# Patient Record
Sex: Male | Born: 1980 | Race: Black or African American | Hispanic: No | Marital: Married | Smoking: Current every day smoker
Health system: Southern US, Community
[De-identification: ages and names within clinical notes are randomized; demographics above are authoritative.]

## PROBLEM LIST (undated history)

## (undated) ENCOUNTER — Ambulatory Visit: Admission: EM | Source: Home / Self Care

## (undated) ENCOUNTER — Ambulatory Visit

---
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
## 2008-06-16 ENCOUNTER — Emergency Department (HOSPITAL_COMMUNITY): Admission: EM | Admit: 2008-06-16 | Discharge: 2008-06-16 | Payer: Self-pay | Admitting: Emergency Medicine

## 2010-04-19 LAB — COMPREHENSIVE METABOLIC PANEL
ALT: 10 U/L (ref 0–53)
AST: 20 U/L (ref 0–37)
Albumin: 4.1 g/dL (ref 3.5–5.2)
Alkaline Phosphatase: 94 U/L (ref 39–117)
BUN: 7 mg/dL (ref 6–23)
CO2: 30 mEq/L (ref 19–32)
Calcium: 9.6 mg/dL (ref 8.4–10.5)
Chloride: 104 mEq/L (ref 96–112)
Creatinine, Ser: 1 mg/dL (ref 0.4–1.5)
GFR calc Af Amer: >60 mL/min (ref >60)
GFR calc non Af Amer: >60 mL/min (ref >60)
Glucose, Bld: 94 mg/dL (ref 70–99)
Potassium: 4.3 mEq/L (ref 3.5–5.1)
Sodium: 142 mEq/L (ref 135–145)
Total Bilirubin: 1 mg/dL (ref 0.3–1.2)
Total Protein: 7.3 g/dL (ref 6.0–8.3)

## 2010-04-19 LAB — POCT I-STAT, CHEM 8
BUN: 7 mg/dL (ref 6–23)
Calcium, Ion: 1.16 mmol/L (ref 1.12–1.32)
Chloride: 102 meq/L (ref 96–112)
Creatinine, Ser: 1.1 mg/dL (ref 0.4–1.5)
Glucose, Bld: 91 mg/dL (ref 70–99)
HCT: 50 % (ref 39.0–52.0)
Hemoglobin: 17 g/dL (ref 13.0–17.0)
Potassium: 4.1 meq/L (ref 3.5–5.1)
Sodium: 141 meq/L (ref 135–145)
TCO2: 29 mmol/L (ref 0–100)

## 2010-04-19 LAB — LIPASE, BLOOD: Lipase: 34 U/L (ref 11–59)

## 2010-04-19 LAB — CBC
HCT: 46.6 % (ref 39.0–52.0)
Hemoglobin: 15.8 g/dL (ref 13.0–17.0)
MCHC: 33.9 g/dL (ref 30.0–36.0)
MCV: 96.8 fL (ref 78.0–100.0)
Platelets: 179 10*3/uL (ref 150–400)
RBC: 4.81 MIL/uL (ref 4.22–5.81)
RDW: 12.9 % (ref 11.5–15.5)
WBC: 10.1 10*3/uL (ref 4.0–10.5)

## 2010-04-19 LAB — POCT CARDIAC MARKERS
CKMB, poc: <1 ng/mL — ABNORMAL LOW (ref 1.0–8.0)
Myoglobin, poc: 73.1 ng/mL (ref 12–200)
Troponin i, poc: <0.05 ng/mL (ref 0.00–0.09)

## 2010-04-19 LAB — DIFFERENTIAL
Basophils Absolute: 0 10*3/uL (ref 0.0–0.1)
Basophils Relative: 0 % (ref 0–1)
Eosinophils Absolute: 0 10*3/uL (ref 0.0–0.7)
Eosinophils Relative: 0 % (ref 0–5)
Lymphocytes Relative: 19 % (ref 12–46)
Lymphs Abs: 2 10*3/uL (ref 0.7–4.0)
Monocytes Absolute: 0.7 10*3/uL (ref 0.1–1.0)
Monocytes Relative: 7 % (ref 3–12)
Neutro Abs: 7.3 10*3/uL (ref 1.7–7.7)
Neutrophils Relative %: 73 % (ref 43–77)

## 2010-04-19 LAB — D-DIMER, QUANTITATIVE: D-Dimer, Quant: <0.22 ug/mL-FEU (ref 0.00–0.48)

## 2010-12-02 ENCOUNTER — Emergency Department (HOSPITAL_COMMUNITY): Payer: Managed Care, Other (non HMO)

## 2010-12-02 ENCOUNTER — Emergency Department (HOSPITAL_COMMUNITY)
Admission: EM | Admit: 2010-12-02 | Discharge: 2010-12-02 | Disposition: A | Discharge status: Home / Self Care | Payer: Managed Care, Other (non HMO) | Attending: Emergency Medicine | Admitting: Emergency Medicine

## 2010-12-02 DIAGNOSIS — R109 Unspecified abdominal pain: Secondary | ICD-10-CM | POA: Insufficient documentation

## 2010-12-02 DIAGNOSIS — S39012A Strain of muscle, fascia and tendon of lower back, initial encounter: Secondary | ICD-10-CM

## 2010-12-02 DIAGNOSIS — X58XXXA Exposure to other specified factors, initial encounter: Secondary | ICD-10-CM | POA: Insufficient documentation

## 2010-12-02 DIAGNOSIS — S335XXA Sprain of ligaments of lumbar spine, initial encounter: Secondary | ICD-10-CM | POA: Insufficient documentation

## 2010-12-02 LAB — CBC
HCT: 47.9 % (ref 39.0–52.0)
Hemoglobin: 16.4 g/dL (ref 13.0–17.0)
MCHC: 34.2 g/dL (ref 30.0–36.0)
MCV: 93 fL (ref 78.0–100.0)
RDW: 13.1 % (ref 11.5–15.5)

## 2010-12-02 LAB — URINALYSIS, ROUTINE W REFLEX MICROSCOPIC
Bilirubin Urine: NEGATIVE
Glucose, UA: NEGATIVE mg/dL
Ketones, ur: NEGATIVE mg/dL
Leukocytes, UA: NEGATIVE
Protein, ur: NEGATIVE mg/dL

## 2010-12-02 LAB — DIFFERENTIAL
Basophils Absolute: 0 10*3/uL (ref 0.0–0.1)
Basophils Relative: 0 % (ref 0–1)
Eosinophils Relative: 1 % (ref 0–5)
Lymphocytes Relative: 19 % (ref 12–46)
Monocytes Absolute: 1 10*3/uL (ref 0.1–1.0)
Monocytes Relative: 8 % (ref 3–12)

## 2010-12-02 LAB — BASIC METABOLIC PANEL
BUN: 6 mg/dL (ref 6–23)
CO2: 29 mEq/L (ref 19–32)
Calcium: 9.4 mg/dL (ref 8.4–10.5)
Creatinine, Ser: 0.93 mg/dL (ref 0.50–1.35)

## 2010-12-02 MED ORDER — SODIUM CHLORIDE 0.9 % IV BOLUS (SEPSIS): 1000.0000 mL | Freq: Once | INTRAVENOUS | Status: DC

## 2010-12-02 MED ORDER — HYDROCODONE-ACETAMINOPHEN 5-325 MG PO TABS: 1.0000 | ORAL_TABLET | ORAL | Status: AC | PRN

## 2010-12-02 MED ORDER — CYCLOBENZAPRINE HCL 10 MG PO TABS
10.0000 mg | ORAL_TABLET | Freq: Once | ORAL | Status: AC
  Administered 2010-12-02: 10 mg via ORAL
  Filled 2010-12-02: qty 1

## 2010-12-02 MED ORDER — HYDROCODONE-ACETAMINOPHEN 5-325 MG PO TABS
1.0000 | ORAL_TABLET | Freq: Once | ORAL | Status: AC
  Administered 2010-12-02: 1 via ORAL
  Filled 2010-12-02: qty 1

## 2010-12-02 MED ORDER — CYCLOBENZAPRINE HCL 10 MG PO TABS: 10.0000 mg | ORAL_TABLET | Freq: Two times a day (BID) | ORAL | Status: AC | PRN

## 2010-12-02 MED ORDER — HYDROMORPHONE HCL PF 1 MG/ML IJ SOLN
0.5000 mg | Freq: Once | INTRAMUSCULAR | Status: DC
  Filled 2010-12-02: qty 1

## 2010-12-02 MED ORDER — KETOROLAC TROMETHAMINE 30 MG/ML IJ SOLN
30.0000 mg | Freq: Once | INTRAMUSCULAR | Status: DC
  Filled 2010-12-02: qty 1

## 2010-12-02 MED ORDER — ONDANSETRON HCL 4 MG/2ML IJ SOLN
4.0000 mg | Freq: Once | INTRAMUSCULAR | Status: DC
  Filled 2010-12-02: qty 2

## 2010-12-02 NOTE — ED Provider Notes (Signed)
History     CSN: [id number] Arrival date & time: 12/02/2010  9:46 AM   First MD Initiated Contact with Patient 12/02/10 1010      No chief complaint on file.   (Consider location/radiation/quality/duration/timing/severity/associated sxs/prior treatment) HPI Comments: Patient states that he was seen at an [facility] 2 weeks ago told that he had blood in his urine so he must have a kidney stone - states no imaging was done, states pain was worse on the left but now with bilateral flank pain - denies frank hematuria, dysuria, fever, chills, testicular pain, urethral discharge.  Patient is a 30 y.o. male presenting with flank pain. The history is provided by the patient. No language interpreter was used.  Flank Pain This is a recurrent problem. The current episode started 1 to 4 weeks ago. The problem occurs constantly. The problem has been unchanged. Pertinent negatives include no abdominal pain, chills, coughing, fever, myalgias, nausea, sore throat, urinary symptoms or vomiting. The symptoms are aggravated by nothing. He has tried nothing for the symptoms.    History reviewed. No pertinent past medical history.  No past surgical history on file.  No family history on file.  History  Substance Use Topics  . Smoking status: Not on file  . Smokeless tobacco: Not on file  . Alcohol Use: Not on file      Review of Systems  Constitutional: Negative for fever and chills.  HENT: Negative for sore throat.   Respiratory: Negative for cough.   Gastrointestinal: Negative for nausea, vomiting and abdominal pain.  Genitourinary: Positive for flank pain.  Musculoskeletal: Negative for myalgias.  All other systems reviewed and are negative.    Allergies  Review of patient's allergies indicates no known allergies.  Home Medications  No current outpatient prescriptions on file.  BP 137/92  Pulse 92  Temp 98.6 F (37 C)  Ht 5\' 10"  (1.778 m)  Wt 195 lb (88.451 kg)  BMI 27.98 kg/m2   SpO2 100%  Physical Exam  Nursing note and vitals reviewed. Constitutional: He is oriented to person, place, and time. He appears well-developed and well-nourished.  HENT:  Head: Normocephalic and atraumatic.  Mouth/Throat: Oropharynx is clear and moist.  Eyes: Conjunctivae are normal. Pupils are equal, round, and reactive to light.  Neck: Normal range of motion. Neck supple.  Cardiovascular: Normal rate, regular rhythm, normal heart sounds and intact distal pulses.   Pulmonary/Chest: Effort normal and breath sounds normal.  Abdominal: Soft. Bowel sounds are normal. He exhibits no distension. There is no tenderness. There is no CVA tenderness.  Musculoskeletal: Normal range of motion.  Neurological: He is alert and oriented to person, place, and time. No cranial nerve deficit.  Skin: Skin is warm and dry.  Psychiatric: He has a normal mood and affect. His behavior is normal. Judgment and thought content normal.    ED Course  Procedures (including critical care time)   Labs Reviewed  CBC  DIFFERENTIAL  BASIC METABOLIC PANEL  URINALYSIS, ROUTINE W REFLEX MICROSCOPIC   No results found.   Lumbar Strain   MDM  CT scan without kidney stones - shows intusseption, no blood in urine.  Believe this to be MSK lower back pain - no alarming signs of cauda equina, epidural abscess or hematoma.        Bryan Matthews [place], [place] 12/02/10 1302

## 2010-12-02 NOTE — ED Notes (Signed)
Pt. Is unable to go to the restroom at this time.  

## 2010-12-02 NOTE — ED Notes (Signed)
Patient here with ongoing bilateral flank pain x 2 months, was diagnosed with kidney stones 2 months ago. Denies dysuria.

## 2010-12-02 NOTE — ED Notes (Signed)
Patient in CT will collect labs when patient return

## 2010-12-02 NOTE — ED Notes (Signed)
Refuses IV and meds until he speaks with Dr. Freida Busman

## 2010-12-04 NOTE — ED Provider Notes (Signed)
Medical screening examination/treatment/procedure(s) were performed by non-physician practitioner and as supervising physician I was immediately available for consultation/collaboration.  Toy Baker, MD 12/04/10 [phone]

## 2011-04-26 ENCOUNTER — Emergency Department (HOSPITAL_COMMUNITY): Payer: Managed Care, Other (non HMO)

## 2011-04-26 ENCOUNTER — Emergency Department (HOSPITAL_COMMUNITY)
Admission: EM | Admit: 2011-04-26 | Discharge: 2011-04-26 | Disposition: A | Discharge status: Home / Self Care | Payer: Managed Care, Other (non HMO) | Attending: Emergency Medicine | Admitting: Emergency Medicine

## 2011-04-26 ENCOUNTER — Encounter (HOSPITAL_COMMUNITY): Payer: Self-pay | Admitting: *Deleted

## 2011-04-26 DIAGNOSIS — J45909 Unspecified asthma, uncomplicated: Secondary | ICD-10-CM | POA: Insufficient documentation

## 2011-04-26 DIAGNOSIS — R079 Chest pain, unspecified: Secondary | ICD-10-CM | POA: Insufficient documentation

## 2011-04-26 MED ORDER — IBUPROFEN 200 MG PO TABS
400.0000 mg | ORAL_TABLET | Freq: Once | ORAL | Status: AC
Start: 2011-04-26 — End: 2011-04-26
  Administered 2011-04-26: 400 mg via ORAL
  Filled 2011-04-26: qty 2

## 2011-04-26 NOTE — ED Notes (Signed)
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

## 2011-04-26 NOTE — ED Notes (Signed)
c/o R chest pain, worse with movement, deep breath. Hurts to lift R arm. Does a lot of pushing/pulling heavy objects. Denies n/v, diaphoresis, sob. No cardiac Hx. Sts had similar pain in back a few months ago that was Dx as muscle spasms. Pt in NAD, A&Ox4, sitting up in bed, on phone.

## 2011-04-26 NOTE — ED Provider Notes (Signed)
History    31 year old male with chest pain. Acute onset this morning around 645. Onset of pain was has patient got up out of bed and was stretching. A right sided. Her reticulocyte. Worsening takes a deep breath. Does not feel short of breath. Pain is also exacerbated by movement of his right shoulder. Went to bed in his usual state of health. No history of similar pain. No unusual leg pain or swelling. Denies history of blood clot. Patient is a smoker.  CSN: [id number]  Arrival date & time 04/26/11  4696   First MD Initiated Contact with Patient 04/26/11 [phone]      Chief Complaint  Patient presents with  . Chest Pain    (Consider location/radiation/quality/duration/timing/severity/associated sxs/prior treatment) HPI  Past Medical History  Diagnosis Date  . Asthma     History reviewed. No pertinent past surgical history.  No family history on file.  History  Substance Use Topics  . Smoking status: Current Everyday Smoker -- 1.0 packs/day  . Smokeless tobacco: Not on file  . Alcohol Use: 7.2 oz/week    12 Cans of beer per week      Review of Systems   Review of symptoms negative unless otherwise noted in HPI.   Allergies  Review of patient's allergies indicates no known allergies.  Home Medications  No current outpatient prescriptions on file.  BP 143/84  Pulse 79  Temp(Src) 97.8 F (36.6 C) (Oral)  Resp 18  Wt 170 lb (77.111 kg)  SpO2 99%  Physical Exam  Nursing note and vitals reviewed. Constitutional: He appears well-developed and well-nourished. No distress.  HENT:  Head: Normocephalic and atraumatic.  Eyes: Conjunctivae are normal. Pupils are equal, round, and reactive to light. Right eye exhibits no discharge. Left eye exhibits no discharge.  Neck: Neck supple.  Cardiovascular: Normal rate, regular rhythm and normal heart sounds.  Exam reveals no gallop and no friction rub.   No murmur heard. Pulmonary/Chest: Effort normal and breath sounds  normal. No respiratory distress. He exhibits tenderness.       Chest wall normal limits to inspection. No lesions noted. Moderate tenderness in the right pectoral. No crepitus. Breath sounds were symmetric with good air movement. Lungs are clear bilaterally.  Abdominal: Soft. He exhibits no distension. There is no tenderness.  Musculoskeletal: He exhibits no edema and no tenderness.       Lower extremities symmetric as compared to each other. There is no calf tenderness. No edema. Negative Homans.  Neurological: He is alert.  Skin: Skin is warm and dry. He is not diaphoretic.  Psychiatric: He has a normal mood and affect. His behavior is normal. Thought content normal.    ED Course  Procedures (including critical care time)  Labs Reviewed - No data to display Dg Chest 2 View  04/26/2011  *RADIOLOGY REPORT*  Clinical Data: Chest pain.  CHEST - 2 VIEW  Comparison: 06/16/2008  Findings: Slight peribronchial thickening. Heart and mediastinal contours are within normal limits.  No focal opacities or effusions.  No acute bony abnormality.  IMPRESSION: Slight bronchitic changes.  Original Report Authenticated By: Cyndie Chime, M.D.     1. Chest pain     EKG:  Rhythm:NSR Rate: 74 Axis: R Intervals: normal ST segments: NS ST changes. t wave flattening in lead III   MDM  31 year old male with chest pain. Patient's EKG does show a large S wave in lead 1 and right axis deviation, but these are nonspecific changes. There is no  prior for comparison. Pulmonary embolism was considered but clinically doubt. Possible musculoskeletal w/ reproducibility. Chest x-ray was obtained and reviewed personally. There is no evidence of pneumothorax or other acute pathology.. Patient has no respiratory distress on exam and symmetric breath sounds b/l. Plan symptomatic treatment. Return precautions were discussed. Outpatient followup as needed.Raeford Razor, MD 04/26/11 [phone]

## 2011-04-26 NOTE — Discharge Instructions (Signed)
Chest Pain (Nonspecific) It is often hard to give a specific diagnosis for the cause of chest pain. There is always a chance that your pain could be related to something serious, such as a heart attack or a blood clot in the lungs. You need to follow up with your caregiver for further evaluation. CAUSES   Heartburn.   Pneumonia or bronchitis.   Anxiety or stress.   Inflammation around your heart (pericarditis) or lung (pleuritis or pleurisy).   A blood clot in the lung.   A collapsed lung (pneumothorax). It can develop suddenly on its own (spontaneous pneumothorax) or from injury (trauma) to the chest.   Shingles infection (herpes zoster virus).  The chest wall is composed of bones, muscles, and cartilage. Any of these can be the source of the pain.  The bones can be bruised by injury.   The muscles or cartilage can be strained by coughing or overwork.   The cartilage can be affected by inflammation and become sore (costochondritis).  DIAGNOSIS  Lab tests or other studies, such as X-rays, electrocardiography, stress testing, or cardiac imaging, may be needed to find the cause of your pain.  TREATMENT   Treatment depends on what may be causing your chest pain. Treatment may include:   Acid blockers for heartburn.   Anti-inflammatory medicine.   Pain medicine for inflammatory conditions.   Antibiotics if an infection is present.   You may be advised to change lifestyle habits. This includes stopping smoking and avoiding alcohol, caffeine, and chocolate.   You may be advised to keep your head raised (elevated) when sleeping. This reduces the chance of acid going backward from your stomach into your esophagus.   Most of the time, nonspecific chest pain will improve within 2 to 3 days with rest and mild pain medicine.  HOME CARE INSTRUCTIONS   If antibiotics were prescribed, take your antibiotics as directed. Finish them even if you start to feel better.   For the next few  days, avoid physical activities that bring on chest pain. Continue physical activities as directed.   Do not smoke.   Avoid drinking alcohol.   Only take over-the-counter or prescription medicine for pain, discomfort, or fever as directed by your caregiver.   Follow your caregiver's suggestions for further testing if your chest pain does not go away.   Keep any follow-up appointments you made. If you do not go to an appointment, you could develop lasting (chronic) problems with pain. If there is any problem keeping an appointment, you must call to reschedule.  SEEK MEDICAL CARE IF:   You think you are having problems from the medicine you are taking. Read your medicine instructions carefully.   Your chest pain does not go away, even after treatment.   You develop a rash with blisters on your chest.  SEEK IMMEDIATE MEDICAL CARE IF:   You have increased chest pain or pain that spreads to your arm, neck, jaw, back, or abdomen.   You develop shortness of breath, an increasing cough, or you are coughing up blood.   You have severe back or abdominal pain, feel nauseous, or vomit.   You develop severe weakness, fainting, or chills.   You have a fever.  THIS IS AN EMERGENCY. Do not wait to see if the pain will go away. Get medical help at once. Call your local emergency services (911 in [place] Do not drive yourself to the hospital. MAKE SURE YOU:   Understand these instructions.     Will watch your condition.   Will get help right away if you are not doing well or get worse.  Document Released: 10/06/2004 Document Revised: 12/16/2010 Document Reviewed: 08/02/2007 [facility] Patient Information 2012 [place], [place].  RESOURCE GUIDE  Dental Problems  Patients with Medicaid: [facility] [phone] [address]                                           [phone] [address] Phone:  [phone]                                                   Phone:  [phone]  If unable to pay or uninsured, contact:  [facility] or [facility]. to become qualified for the adult dental clinic.  Chronic Pain Problems Contact [facility]  [phone] Patients need to be referred by their primary care doctor.  Insufficient Money for Medicine Contact United Way:  call "211" or [facility] 581-507-4060.  No Primary Care Doctor Call [facility]  [phone] Other agencies that provide inexpensive medical care    [facility]  [phone]    [facility] Internal Medicine  [phone]    [facility]  [phone]    [facility]  [phone]    [facility]  [phone]    [facility] Child Clinic  [phone]  Psychological Services [facility] Behavioral Health  [phone] [facility] Services  [phone] [facility] Mental Health   [phone] (emergency services (724)235-0299)  Substance Abuse Resources Alcohol and Drug Services  219-085-4925 [facility] [phone] [facility] [phone] Floydene Flock [phone] Residential & Outpatient Substance Abuse Program  623-813-4119  Abuse/Neglect [facility] Child Abuse Hotline 7324430826 [facility] Child Abuse Hotline 613-355-1006 (After Hours)  Emergency [facility] Ministries (928) [phone]  Maternity Homes [facility] at the [place] [facility] [phone] [facility] 9403505928  MRSA Hotline #:   984-797-2868    [facility] Resources  [facility]     [employer]                          [facility] Dept. [address] [place]                       [address]      [address]                                                  Cristobal Goldmann Phone:  [phone]  Phone:  [phone]                 Phone:  [phone]  [facility] Mental Health Phone:   [phone]  [facility] Child Abuse Hotline 475 037 7747 (209)154-1208 (After Hours)  Chest Pain, Nonspecific It is often hard to give a specific diagnosis for the cause of chest pain. There is always a chance that your pain could be related to something serious, like a heart attack or a blood clot in the lungs. You need to follow up with your caregiver for further evaluation. More lab tests or other studies such as X-rays, electrocardiography, stress testing, or cardiac imaging may be needed to find the cause of your pain. Most of the time, nonspecific chest pain improves within 2 to 3 days with rest and mild pain medicine. For the next few days, avoid physical exertion or activities that bring on pain. Do not smoke. Avoid drinking alcohol. Call your caregiver for routine follow-up as advised.  SEEK IMMEDIATE MEDICAL CARE IF:  You develop increased chest pain or pain that radiates to the arm, neck, jaw, back, or abdomen.   You develop shortness of breath, increased coughing, or you start coughing up blood.   You have severe back or abdominal pain, nausea, or vomiting.   You develop severe weakness, fainting, fever, or chills.  Document Released: 12/27/2004 Document Revised: 12/16/2010 Document Reviewed: 06/16/2006 [facility] Patient Information 2012 [place], [place].

## 2012-12-23 ENCOUNTER — Emergency Department (HOSPITAL_COMMUNITY): Payer: Self-pay

## 2012-12-23 ENCOUNTER — Encounter (HOSPITAL_COMMUNITY): Payer: Self-pay | Admitting: Emergency Medicine

## 2012-12-23 ENCOUNTER — Emergency Department (HOSPITAL_COMMUNITY)
Admission: EM | Admit: 2012-12-23 | Discharge: 2012-12-23 | Disposition: A | Discharge status: Home / Self Care | Payer: Self-pay | Attending: Emergency Medicine | Admitting: Emergency Medicine

## 2012-12-23 DIAGNOSIS — J45901 Unspecified asthma with (acute) exacerbation: Secondary | ICD-10-CM | POA: Insufficient documentation

## 2012-12-23 DIAGNOSIS — Z7982 Long term (current) use of aspirin: Secondary | ICD-10-CM | POA: Insufficient documentation

## 2012-12-23 DIAGNOSIS — M940 Chondrocostal junction syndrome [Tietze]: Secondary | ICD-10-CM | POA: Insufficient documentation

## 2012-12-23 DIAGNOSIS — F17200 Nicotine dependence, unspecified, uncomplicated: Secondary | ICD-10-CM | POA: Insufficient documentation

## 2012-12-23 DIAGNOSIS — R0789 Other chest pain: Secondary | ICD-10-CM | POA: Insufficient documentation

## 2012-12-23 LAB — CBC
HCT: 46.3 % (ref 39.0–52.0)
Hemoglobin: 16.5 g/dL (ref 13.0–17.0)
MCH: 32.3 pg (ref 26.0–34.0)
MCHC: 35.6 g/dL (ref 30.0–36.0)
MCV: 90.6 fL (ref 78.0–100.0)
RDW: 13.5 % (ref 11.5–15.5)

## 2012-12-23 LAB — BASIC METABOLIC PANEL
BUN: 12 mg/dL (ref 6–23)
Calcium: 9.2 mg/dL (ref 8.4–10.5)
Creatinine, Ser: 0.91 mg/dL (ref 0.50–1.35)
GFR calc Af Amer: >90 mL/min (ref >90)
GFR calc non Af Amer: >90 mL/min (ref >90)
Glucose, Bld: 110 mg/dL — ABNORMAL HIGH (ref 70–99)
Potassium: 3.9 mEq/L (ref 3.5–5.1)

## 2012-12-23 LAB — POCT I-STAT TROPONIN I: Troponin i, poc: 0.04 ng/mL (ref 0.00–0.08)

## 2012-12-23 MED ORDER — MELOXICAM 15 MG PO TABS: 15.0000 mg | ORAL_TABLET | Freq: Every day | ORAL | Status: DC

## 2012-12-23 MED ORDER — CYCLOBENZAPRINE HCL 10 MG PO TABS
5.0000 mg | ORAL_TABLET | Freq: Once | ORAL | Status: AC
  Administered 2012-12-23: 5 mg via ORAL
  Filled 2012-12-23: qty 1

## 2012-12-23 MED ORDER — ASPIRIN 81 MG PO CHEW
324.0000 mg | CHEWABLE_TABLET | Freq: Once | ORAL | Status: AC
  Administered 2012-12-23: 324 mg via ORAL
  Filled 2012-12-23: qty 4

## 2012-12-23 MED ORDER — ALBUTEROL SULFATE (5 MG/ML) 0.5% IN NEBU
5.0000 mg | INHALATION_SOLUTION | Freq: Once | RESPIRATORY_TRACT | Status: AC
  Administered 2012-12-23: 5 mg via RESPIRATORY_TRACT
  Filled 2012-12-23: qty 1

## 2012-12-23 MED ORDER — CYCLOBENZAPRINE HCL 10 MG PO TABS: 10.0000 mg | ORAL_TABLET | Freq: Three times a day (TID) | ORAL | Status: DC | PRN

## 2012-12-23 MED ORDER — KETOROLAC TROMETHAMINE 30 MG/ML IJ SOLN
30.0000 mg | Freq: Once | INTRAMUSCULAR | Status: AC
  Administered 2012-12-23: 30 mg via INTRAVENOUS
  Filled 2012-12-23: qty 1

## 2012-12-23 NOTE — ED Provider Notes (Signed)
CSN: [id number]     Arrival date & time 12/23/12  0203 History   First MD Initiated Contact with Patient 12/23/12 0302     Chief Complaint  Patient presents with  . Chest Pain   HPI  History provided by the patient. The patient is a 32 year old male with history of asthma who is a current every day smoker and presents with complaints of left chest pain. Patient states that he awoke from his sleep around 11:00 PM with significant sharp severe left chest pain. Patient states that if he attempts to move his left arm pain is worsened. Pain does not radiate significantly. It is a sharp stabbing and squeezing pain. It is associated with slight shortness of breath. Denies any heart palpitations. Patient does report that he was playing with his daughter earlier in the evening prior to going to sleep. Denies any specific injury while lifting or playing with her. He has not used any medications or treatment for symptoms. Denies any other aggravating or alleviating factors. Denies any associated diaphoresis or nausea. No recent URI symptoms or fever.    Past Medical History  Diagnosis Date  . Asthma    History reviewed. No pertinent past surgical history. History reviewed. No pertinent family history. History  Substance Use Topics  . Smoking status: Current Every Day Smoker -- 1.00 packs/day  . Smokeless tobacco: Not on file  . Alcohol Use: 7.2 oz/week    12 Cans of beer per week    Review of Systems  Constitutional: Negative for fever and diaphoresis.  Respiratory: Negative for cough.   Cardiovascular: Positive for chest pain. Negative for palpitations and leg swelling.  All other systems reviewed and are negative.    Allergies  Review of patient's allergies indicates no known allergies.  Home Medications   Current Outpatient Rx  Name  Route  Sig  Dispense  Refill  . aspirin EC 325 MG tablet   Oral   Take 325 mg by mouth daily.          BP 158/97  Pulse 92  Temp(Src) 97.9 F  (36.6 C) (Oral)  Resp 20  Ht 5\' 7"  (1.702 m)  Wt 165 lb (74.844 kg)  BMI 25.84 kg/m2  SpO2 98% Physical Exam  Nursing note and vitals reviewed. Constitutional: He is oriented to person, place, and time. He appears well-developed and well-nourished.  HENT:  Head: Normocephalic.  Cardiovascular: Normal rate and regular rhythm.   Pulmonary/Chest: Effort normal and breath sounds normal. No respiratory distress. He has no wheezes. He has no rales. He exhibits tenderness.    Point tenderness over the sternal clavicle joint without deformity. No erythema of the skin. There is some tenderness over the lateral left pectoralis major muscle as well. Pain is worsened with abduction and external rotation of the left arm and shoulder.  Abdominal: Soft. There is no tenderness. There is no rebound and no guarding.  Musculoskeletal: He exhibits no edema.  Neurological: He is alert and oriented to person, place, and time.  Skin: Skin is warm. No rash noted. No erythema.  Psychiatric: He has a normal mood and affect. His behavior is normal.    ED Course  Procedures   DIAGNOSTIC STUDIES: Oxygen Saturation is 98% on room air.    COORDINATION OF CARE:  Nursing notes reviewed. Vital signs reviewed. Initial pt interview and examination performed.   3:27 AM-patient seen and evaluated. Patient does appear in mild discomfort. His pain is significant reproducible over the sternal clavicle  joint and pectoralis major muscle areas. No gross deformities. Patient is PERC negative. Discussed work up plan with pt at bedside, which includes lab testing, EKG and chest x-ray. Pt agrees with plan.  Patient reports having significant improvements of pain and discomfort after medications. They're still tenderness to palpation over the left chest wall. Unremarkable lab testing, EKG, troponin and chest x-ray. At this time do not suspect any concerning cardiopulmonary cause of symptoms. Patient is in good condition for  discharge was symptomatically of muscle skeletal chest pain. He agrees with plan.   Treatment plan initiated: Medications  aspirin chewable tablet 324 mg (324 mg Oral Given 12/23/12 0401)  ketorolac (TORADOL) 30 MG/ML injection 30 mg (30 mg Intravenous Given 12/23/12 0402)  albuterol (PROVENTIL) (5 MG/ML) 0.5% nebulizer solution 5 mg (5 mg Nebulization Given 12/23/12 0401)  cyclobenzaprine (FLEXERIL) tablet 5 mg (5 mg Oral Given 12/23/12 0401)   Results for orders placed during the hospital encounter of 12/23/12  CBC      Result Value Range   WBC 13.5 (*) 4.0 - 10.5 K/uL   RBC 5.11  4.22 - 5.81 MIL/uL   Hemoglobin 16.5  13.0 - 17.0 g/dL   HCT 16.1  09.6 - 04.5 %   MCV 90.6  78.0 - 100.0 fL   MCH 32.3  26.0 - 34.0 pg   MCHC 35.6  30.0 - 36.0 g/dL   RDW 40.9  81.1 - 91.4 %   Platelets 252  150 - 400 K/uL  BASIC METABOLIC PANEL      Result Value Range   Sodium 137  135 - 145 mEq/L   Potassium 3.9  3.5 - 5.1 mEq/L   Chloride 100  96 - 112 mEq/L   CO2 23  19 - 32 mEq/L   Glucose, Bld 110 (*) 70 - 99 mg/dL   BUN 12  6 - 23 mg/dL   Creatinine, Ser 7.82  0.50 - 1.35 mg/dL   Calcium 9.2  8.4 - 95.6 mg/dL   GFR calc non Af Amer >90  >90 mL/min   GFR calc Af Amer >90  >90 mL/min  POCT I-STAT TROPONIN I      Result Value Range   Troponin i, poc 0.04  0.00 - 0.08 ng/mL   Comment 3               Imaging Review Dg Chest 2 View  12/23/2012   CLINICAL DATA:  Chest pain, shortness of breath, history of asthma  EXAM: CHEST  2 VIEW  COMPARISON:  Prior radiograph from 04/26/2011  FINDINGS: The cardiac and mediastinal silhouettes are within normal limits.  The lungs are normally inflated. Mild diffuse bronchitic changes are noted. No airspace consolidation, pleural effusion, or pulmonary edema is identified. There is no pneumothorax.  No acute osseous abnormality identified.  IMPRESSION: Mild diffuse bronchitic changes, likely related to smoking. No acute cardiopulmonary process.    Electronically Signed   By: Rise Mu M.D.   On: 12/23/2012 03:27    EKG Interpretation    Date/Time:  Sunday December 23 2012 02:07:56 EST Ventricular Rate:  96 PR Interval:  150 QRS Duration: 84 QT Interval:  332 QTC Calculation: 419 R Axis:   87 Text Interpretation:  Normal sinus rhythm Nonspecific ST and T wave abnormality No significant change since last tracing Confirmed by PLUNKETT  MD, WHITNEY ([id number]) on 12/23/2012 2:09:28 AM            MDM   1. Musculoskeletal chest pain  2. Costochondritis        Angus Seller, PA-C 12/24/12 0400

## 2012-12-23 NOTE — ED Notes (Signed)
Patient medicated, see MAR.

## 2012-12-23 NOTE — ED Notes (Signed)
Pt arrived to ED with a complaint of chest pain that started last night around 2200 hrs.  Pain feels like a squeezing and twisting sensation.  Pt states that he has had chest wall pain previously but that this pain is not like that. Pt state he has had some shorrtness of breath.

## 2012-12-23 NOTE — ED Notes (Signed)
Patient states that he was playing with his son last night and then his left arm "gave way just a little bit." Patient with c/o midsternal CP that radiates towards left arm Patient states that he has had previous episodes of CP, but does not remember the diagnosis when he went to ED--"I think they said inflammation in my chest."

## 2012-12-24 NOTE — ED Provider Notes (Signed)
Medical screening examination/treatment/procedure(s) were performed by non-physician practitioner and as supervising physician I was immediately available for consultation/collaboration.  EKG Interpretation    Date/Time:  Sunday December 23 2012 02:07:56 EST Ventricular Rate:  96 PR Interval:  150 QRS Duration: 84 QT Interval:  332 QTC Calculation: 419 R Axis:   87 Text Interpretation:  Normal sinus rhythm Nonspecific ST and T wave abnormality No significant change since last tracing Confirmed by Anitra Lauth  MD, Clevie Prout ([id number]) on 12/23/2012 2:09:28 AM              Gwyneth Sprout, MD 12/24/12 1610

## 2014-03-24 ENCOUNTER — Encounter (HOSPITAL_COMMUNITY): Payer: Self-pay | Admitting: Emergency Medicine

## 2014-03-24 ENCOUNTER — Emergency Department (HOSPITAL_COMMUNITY)
Admission: EM | Admit: 2014-03-24 | Discharge: 2014-03-24 | Disposition: A | Discharge status: Home / Self Care | Payer: Managed Care, Other (non HMO) | Attending: Emergency Medicine | Admitting: Emergency Medicine

## 2014-03-24 ENCOUNTER — Emergency Department (HOSPITAL_COMMUNITY): Payer: Managed Care, Other (non HMO)

## 2014-03-24 DIAGNOSIS — Z791 Long term (current) use of non-steroidal anti-inflammatories (NSAID): Secondary | ICD-10-CM | POA: Insufficient documentation

## 2014-03-24 DIAGNOSIS — R0789 Other chest pain: Secondary | ICD-10-CM | POA: Insufficient documentation

## 2014-03-24 DIAGNOSIS — J45901 Unspecified asthma with (acute) exacerbation: Secondary | ICD-10-CM | POA: Insufficient documentation

## 2014-03-24 DIAGNOSIS — Z720 Tobacco use: Secondary | ICD-10-CM | POA: Insufficient documentation

## 2014-03-24 DIAGNOSIS — M791 Myalgia: Secondary | ICD-10-CM | POA: Insufficient documentation

## 2014-03-24 DIAGNOSIS — Z7982 Long term (current) use of aspirin: Secondary | ICD-10-CM | POA: Insufficient documentation

## 2014-03-24 DIAGNOSIS — R0602 Shortness of breath: Secondary | ICD-10-CM | POA: Insufficient documentation

## 2014-03-24 DIAGNOSIS — R079 Chest pain, unspecified: Secondary | ICD-10-CM

## 2014-03-24 LAB — CBC
HCT: 47.7 % (ref 39.0–52.0)
Hemoglobin: 16.6 g/dL (ref 13.0–17.0)
MCH: 32.2 pg (ref 26.0–34.0)
MCHC: 34.8 g/dL (ref 30.0–36.0)
MCV: 92.6 fL (ref 78.0–100.0)
Platelets: 223 10*3/uL (ref 150–400)
RBC: 5.15 MIL/uL (ref 4.22–5.81)
RDW: 13 % (ref 11.5–15.5)
WBC: 9.3 10*3/uL (ref 4.0–10.5)

## 2014-03-24 LAB — BASIC METABOLIC PANEL
Anion gap: 9 (ref 5–15)
BUN: 11 mg/dL (ref 6–23)
CO2: 25 mmol/L (ref 19–32)
Calcium: 9.1 mg/dL (ref 8.4–10.5)
Chloride: 105 mmol/L (ref 96–112)
Creatinine, Ser: 1.03 mg/dL (ref 0.50–1.35)
GFR calc Af Amer: >90 mL/min (ref >90)
GLUCOSE: 116 mg/dL — AB (ref 70–99)
POTASSIUM: 3.7 mmol/L (ref 3.5–5.1)
Sodium: 139 mmol/L (ref 135–145)

## 2014-03-24 LAB — I-STAT TROPONIN, ED: Troponin i, poc: 0 ng/mL (ref 0.00–0.08)

## 2014-03-24 MED ORDER — NAPROXEN 500 MG PO TABS: 500.0000 mg | ORAL_TABLET | Freq: Two times a day (BID) | ORAL | Status: DC

## 2014-03-24 NOTE — Discharge Instructions (Signed)
Chest Wall Pain °Chest wall pain is pain in or around the bones and muscles of your chest. It may take up to 6 weeks to get better. It may take longer if you must stay physically active in your work and activities.  °CAUSES  °Chest wall pain may happen on its own. However, it may be caused by: °· A viral illness like the flu. °· Injury. °· Coughing. °· Exercise. °· Arthritis. °· Fibromyalgia. °· Shingles. °HOME CARE INSTRUCTIONS  °· Avoid overtiring physical activity. Try not to strain or perform activities that cause pain. This includes any activities using your chest or your abdominal and side muscles, especially if heavy weights are used. °· Put ice on the sore area. °¨ Put ice in a plastic bag. °¨ Place a towel between your skin and the bag. °¨ Leave the ice on for 15-20 minutes per hour while awake for the first 2 days. °· Only take over-the-counter or prescription medicines for pain, discomfort, or fever as directed by your caregiver. °SEEK IMMEDIATE MEDICAL CARE IF:  °· Your pain increases, or you are very uncomfortable. °· You have a fever. °· Your chest pain becomes worse. °· You have new, unexplained symptoms. °· You have nausea or vomiting. °· You feel sweaty or lightheaded. °· You have a cough with phlegm (sputum), or you cough up blood. °MAKE SURE YOU:  °· Understand these instructions. °· Will watch your condition. °· Will get help right away if you are not doing well or get worse. °Document Released: 12/27/2004 Document Revised: 03/21/2011 Document Reviewed: 08/23/2010 °ExitCare® Patient Information ©2015 [employer]. This information is not intended to replace advice given to you by your health care provider. Make sure you discuss any questions you have with your health care provider. ° ° °Emergency Department Resource Guide °1) Find a Doctor and Pay Out of Pocket °Although you won't have to find out who is covered by your insurance plan, it is a good idea to ask around and get recommendations. You  will then need to call the office and see if the doctor you have chosen will accept you as a new patient and what types of options they offer for patients who are self-pay. Some doctors offer discounts or will set up payment plans for their patients who do not have insurance, but you will need to ask so you aren't surprised when you get to your appointment. ° °2) Contact Your Local Health Department °Not all health departments have doctors that can see patients for sick visits, but many do, so it is worth a call to see if yours does. If you don't know where your local health department is, you can check in your phone book. The CDC also has a tool to help you locate your state's health department, and many state websites also have listings of all of their local health departments. ° °3) Find a Walk-in Clinic °If your illness is not likely to be very severe or complicated, you may want to try a walk in clinic. These are popping up all over the country in pharmacies, drugstores, and shopping centers. They're usually staffed by nurse practitioners or physician assistants that have been trained to treat common illnesses and complaints. They're usually fairly quick and inexpensive. However, if you have serious medical issues or chronic medical problems, these are probably not your best option. ° °No Primary Care Doctor: °- Call [facility] Connect at  832-8000 - they can help you locate a primary care doctor that    accepts your insurance, provides certain services, etc. °- Physician Referral Service- 1-800-533-3463 ° °Chronic Pain Problems: °Organization         Address  Phone   Notes  °[facility]  (336) 297-2271 Patients need to be referred by their primary care doctor.  ° °Medication Assistance: °Organization         Address  Phone   Notes  °[place] [facility] [address] °[place], [place] [address] (336) 641-8030 --Must be a resident of [place] °-- Must  have NO insurance coverage whatsoever (no Medicaid/ Medicare, etc.) °-- The pt. MUST have a primary care doctor that directs their care regularly and follows them in the community °  °[facility]  (866) 331-1348   °[employer]  (888) 892-1162   ° °Agencies that provide inexpensive medical care: °Organization         Address  Phone   Notes  °[facility]  (336) 832-8035   °[facility]    (336) 832-7272   °[facility] [address] °[place], [place] [address] (336) 832-4777   °[facility] [address], °[place] (336) 271-4999   °[facility]    (336) 373-0678   °[facility]    (336) 272-1050   °[facility] ° [address], [place] Phone:  (336) 832-4444, Fax:  (336) 832-4440 Hours of Operation:  9 am - 6 pm, M-F.  Also accepts Medicaid/Medicare and self-pay.  °[facility] ° [address], [place] Phone: (336) 832-3150, Fax: (336) 832-3151. Hours of Operation:  8:30 am - 5:30 pm, M-F.  Also accepts Medicaid and self-pay.  °[facility] [place] [address], [place] Phone: (336) 878-6027   °[facility] [address], [place], [place] (336)723-1848, Ext. 123 Mondays & Thursdays: 7-9 AM.  First 15 patients are seen on a first come, first serve basis. °  ° °Medicaid-accepting [place] Providers: ° °Organization         Address  Phone   Notes  °[facility] [address], [place] (336) 641-2100 Also accepts self-pay patients.  °[facility] [address], [place] ° (336) 856-9996   °[facility] [address], [place] (336) 288-8857   °[facility] [address] [place] [address], [place] (336) 299-7000   °Veita Bland [address], [place]  ° (336) 373-1557 Only accepts [place] Access Medicaid patients after  they have their name applied to their card.  ° °Self-Pay (no insurance) in [place]: ° °Organization         Address  Phone   Notes  °Sickle Cell Patients, [facility] [address], [place] (336) 832-1970   °[facility] [address], [place] (336) 832-4400   °[facility] ° [address], [place] (336) 992-4800   °[facility]/Dr. Osei-Bonsu ° [address] [place] [address], [place] or [address], [place] (336) 841-8500 Phone number for both [place] and [place] locations is the same.  °[facility] [address], [place] (336) 299-0000   °[facility] [address] [place] [address],  or [address] (336) 852-7530 °(336) 878-2260   °[facility] [address], [place] (336) 350-1642, phone; (336) 294-5005, fax Sees patients 1st and 3rd Saturday of every month.  Must not qualify for public or private insurance (i.e. Medicaid, Medicare, [place] Health Choice, Veterans' Benefits) • Household income should be no more than 200% of the poverty level •The clinic cannot treat you if you are pregnant or think you are pregnant • Sexually transmitted diseases are not treated at the clinic.  ° ° °Dental Care: °Organization         Address  Phone  Notes  °[facility] [address], [place] (336) 641-6152 Accepts children up to age 21 who are enrolled in Medicaid or [place] Health Choice; pregnant women with a Medicaid card; and children who have applied for Medicaid or [place] Health Choice, but were declined, whose parents can pay a reduced fee at time of service.  °[facility] [place]  [address], [place] (336) 641-7733 Accepts children up to age 21 who are enrolled in Medicaid or [place] Health Choice; pregnant women with a Medicaid card; and children who  have applied for Medicaid or [place] Health Choice, but were declined, whose parents can pay a reduced fee at time of service.  °[facility] ° [address], [place] (336) 641-4533 Patients are seen by appointment only. Walk-ins are not accepted. [facility] will see patients 18 years of age and older. °Monday - Tuesday (8am-5pm) °Most Wednesdays (8:30-5pm) °$30 per visit, cash only  °[facility] ° [address], [place] (336) 641-4533 Patients are seen by appointment only. Walk-ins are not accepted. [facility] will see patients 18 years of age and older. °One Wednesday Evening (Monthly: Volunteer Based).  $30 per visit, cash only  °[facility]  (919) 537-3737 for adults; Children under age 4, call Graduate Pediatric Dentistry at ([phone]) [phone]. Children aged 4-14, please call (919) [phone] to request a pediatric application. ° Dental services are provided in all areas of dental care including fillings, crowns and bridges, complete and partial dentures, implants, gum treatment, root canals, and extractions. Preventive care is also provided. Treatment is provided to both adults and children. °Patients are selected via a lottery and there is often a waiting list. °  °[facility] [address], °[place] ° (336) 763-8833 [email or web address] °  °[facility] [address], [place], [place] (336)723-1848, Ext. 123 Second and Fourth Thursday of each month, opens at 6:30 AM; Clinic ends at 9 AM.  Patients are seen on a first-come first-served basis, and a limited number are seen during each clinic.  ° °[facility] ° [address], [place], [place] (336) 723-7904   Eligibility Requirements °You must have lived in [place], [place], or [place] counties for at least the last three months. °  You cannot be eligible for state or federal sponsored healthcare insurance, including Veterans  Administration, Medicaid, or Medicare. °  You generally cannot be eligible for healthcare insurance through your employer.  °  How to apply: °Eligibility screenings are held every Tuesday and Wednesday afternoon from 1:00 pm until 4:00 pm. You do not need an appointment for the interview!  °[facility] [address], [place], [place] 336-631-2330   °[facility]  336-342-8273   °[facility]  336-703-3100   °[facility]    336-570-6415   ° °Behavioral Health Resources in the Community: °Intensive Outpatient Programs °Organization         Address  Phone  Notes  °[facility] [address], [place], [place] 336-878-6098   °[facility] [address], [place], [place] 336-832-9800   °[facility] [address], [place], [place] ° 336-882-2125   °[facility] [address],  °[place], [place] 1-800-853-5163 or 336-641-4981   °Substance Abuse Resources °Organization         Address  Phone  Notes  °[facility]  336-882-2125   °[facility]  336-784-9470   °[facility]  336-285-9073   °[facility]  336-845-3988   °Residential & Outpatient Substance Abuse Program  1-800-659-3381   °Psychological Services °Organization         Address  Phone  Notes  °[facility]  336- 832-9600   °[facility]  336- 378-7881   °[facility] [address], [place] 1-800-853-5163 or 336-641-4981   ° °Mobile Crisis Teams °Organization         Address  Phone  Notes  °[facility], Mobile Crisis Care Unit  1-877-626-1772   °Assertive °[facility] ° [address] [place], [place] 336-834-9664   °Sharon DeEsch [address] °[place] 336-554-5454   ° °Self-Help/Support Groups °Organization         Address  Phone             Notes  °[facility] [place] [facility] -  variety of support groups  336- 373-1402 Call for more information  °Narcotics Anonymous (NA), [facility] [address], °[place]  2 meetings at this location  ° °Residential Treatment Programs °Organization         Address  Phone  Notes  °[facility] [address],    °[place]  1-866-801-8205   °[facility] ° [address], [place], [place] 704-293-8524   °[facility] [address], [place] 336-845-3988 Admissions: 8am-3pm M-F  °[facility] [address]    °[place], [place] 336-841-1104   °[facility] [address], [place], [place] 336-379-7146   °[facility] [address]  °[place], [place] 336-285-9073   °[facility] - Intensive Outpatient [address], [place], [place] 336-852-3033   °[facility] [address]  °[place], [place] 1-877-615-2722 or 336-784-9470   °[facility] [address] [place], [place] 336-227-7417 Accepts Medicaid  °[facility] [address]  °[place] 1-800-659-3381 Substance Abuse/Addiction Treatment  ° °[place] Behavioral Health Resources °Organization         Address  Phone  Notes  °[facility]  (888) 581-9988   °Julie Brannon, PhD [address] [place],    (336) 349-5553 or (336) 951-0000   °[facility]   [address] °[place],  (336) 349-4454   °[facility] [address], [place],  (336) 342-8316 Insurance/Medicaid/sponsorship through [facility]  °[facility] [address]                                    [place],  (336) 342-8316 Therapy/tele-psych/case  °[facility] [address]  ° [place], [place] (336) 349-2233    °Dr. Arfeen  (336) 349-4544   °[facility] [place]  [employer] [place] [facility] 1) [address], [place] °2) [address], [place] °3)  [address], [place] (336) 349-3220 °(336) 342-7768 ° °(336) 342-8140   °[place] [facility] (336) 342-1394 or (336) 342-3537 (After Hours)    ° ° ° °

## 2014-03-24 NOTE — ED Notes (Signed)
Right sided chest pain increasing over last week. States sharp pain going through ribs/lung on right side with deep breaths, right arm fatigue increasing over week. History of chest wall muscle spasms

## 2014-03-24 NOTE — ED Provider Notes (Signed)
CSN: [id number]     Arrival date & time 03/24/14  1256 History   None    Chief Complaint  Patient presents with  . Chest Pain  . Shortness of Breath     (Consider location/radiation/quality/duration/timing/severity/associated sxs/prior Treatment) The history is provided by the patient. No language interpreter was used.   Mr. Bryan Matthews is a 34 y.o black male who presents for gradual onset right sided chest pain for the past 10 days that is worse today, described as a sharp pain, and worse with movement of the right arm and taking a deep breath.  The pain is now 7/10. He has tried taking 2 baby aspirin without relief. He also took two tylenol this morning that has helped with the pain. He denies any recent injury. He denies any shortness of breath, nausea, vomiting, or syncope.  Past Medical History  Diagnosis Date  . Asthma    History reviewed. No pertinent past surgical history. History reviewed. No pertinent family history. History  Substance Use Topics  . Smoking status: Current Every Day Smoker -- 1.00 packs/day  . Smokeless tobacco: Not on file  . Alcohol Use: 7.2 oz/week    12 Cans of beer per week    Review of Systems  HENT: Negative for ear pain and sore throat.   Respiratory: Positive for shortness of breath. Negative for cough.   Gastrointestinal: Negative for nausea, vomiting and abdominal pain.  Musculoskeletal: Positive for myalgias.  Skin: Negative for rash.  Neurological: Negative for dizziness and syncope.  All other systems reviewed and are negative.     Allergies  Review of patient's allergies indicates no known allergies.  Home Medications   Prior to Admission medications   Medication Sig Start Date End Date Taking? Authorizing Provider  aspirin EC 325 MG tablet Take 325 mg by mouth daily.   Yes Historical Provider, MD  cyclobenzaprine (FLEXERIL) 10 MG tablet Take 1 tablet (10 mg total) by mouth 3 (three) times daily as needed for muscle spasms. Patient  not taking: Reported on 03/24/2014 12/23/12   Ivonne Andrew, PA-C  meloxicam (MOBIC) 15 MG tablet Take 1 tablet (15 mg total) by mouth daily. Patient not taking: Reported on 03/24/2014 12/23/12   Ivonne Andrew, PA-C  naproxen (NAPROSYN) 500 MG tablet Take 1 tablet (500 mg total) by mouth 2 (two) times daily. 03/24/14   Kristan Brummitt Patel-Mills, PA-C   BP 144/93 mmHg  Pulse 80  Temp(Src) 97.6 F (36.4 C) (Oral)  Resp 16  SpO2 98% Physical Exam  Constitutional: He is oriented to person, place, and time. He appears well-developed and well-nourished.  HENT:  Head: Normocephalic and atraumatic.  Eyes: Conjunctivae are normal.  Neck: Normal range of motion. Neck supple.  Cardiovascular: Normal rate, regular rhythm and normal heart sounds.   Pulmonary/Chest: Effort normal and breath sounds normal. No respiratory distress.  He has reproducible tenderness over the right pectoral muscle and pain with passive and active abduction of the right arm.   Abdominal: Soft. There is no tenderness.  Musculoskeletal: Normal range of motion.  Neurological: He is alert and oriented to person, place, and time.  Skin: Skin is warm and dry.  Nursing note and vitals reviewed.   ED Course  Procedures (including critical care time) Labs Review Labs Reviewed  BASIC METABOLIC PANEL - Abnormal; Notable for the following:    Glucose, Bld 116 (*)    All other components within normal limits  CBC  I-STAT TROPOININ, ED    Imaging Review Dg  Chest 2 View  03/24/2014   CLINICAL DATA:  Chest pain for 2 weeks.  EXAM: CHEST  2 VIEW  COMPARISON:  December 23, 2012.  FINDINGS: The heart size and mediastinal contours are within normal limits. Both lungs are clear. No pneumothorax or pleural effusion is noted. The visualized skeletal structures are unremarkable.  IMPRESSION: No active cardiopulmonary disease.   Electronically Signed   By: Lupita RaiderJames  Green Jr, M.D.   On: 03/24/2014 16:18     EKG Interpretation None      MDM    Final diagnoses:  Chest pain, unspecified chest pain type   He appears comfortable. Afebrile and 97% O2. He did not want anything for pain and said he took 2 tylenol this morning although originally he told me his pain was 7/10.  Troponin is negative.  CBC and BMP are unremarkable.  CXR shows no pneumothorax, no pleural effusion, no pneumonia.  EKG is normal sinus rhythm at 77.  PERC and HEART score were reviewed.  He is at low risk for a PE or acute coronary event.   I explained that he should f/u with his PCP and take Naproxen for pain as prescribed and he agreed with the plan.     Catha GosselinHanna Patel-Mills, PA-C 03/25/14 0014  Eber HongBrian Miller, MD 03/25/14 1116

## 2014-03-24 NOTE — ED Provider Notes (Signed)
ED ECG REPORT  I personally interpreted this EKG   Date: 03/25/2014   Rate: 77  Rhythm: normal sinus rhythm  QRS Axis: right  Intervals: normal  ST/T Wave abnormalities: normal  Conduction Disutrbances:none  Narrative Interpretation:   Old EKG Reviewed: none available   History: 34 year old male, presents with right-sided chest pain, onset last week, has been persistent, worse with moving his arm, changing position, not exertional, not related to coughing fevers or difficulty breathing.  Physical Exam: On exam the patient has reducible tenderness over his right chest wall, no signs of zoster, no rash, lungs are clear, heart is regular rate clear, no signs of murmurs, chest x-ray without acute findings, labs normal, vital signs show mild hypertension,   Assessment: the patient has been counseled about return, follow-up with family doctor, and Saturdays, stable for discharge.  Medical screening examination/treatment/procedure(s) were conducted as a shared visit with non-physician practitioner(s) and myself.  I personally evaluated the patient during the encounter.  Clinical Impression:   Final diagnoses:  Chest pain, unspecified chest pain type          Eber HongBrian Chenika Nevils, MD 03/25/14 1116

## 2014-03-24 NOTE — ED Notes (Signed)
MD at bedside. 

## 2014-03-24 NOTE — Progress Notes (Signed)
  CARE MANAGEMENT ED NOTE 03/24/2014  Patient:  Bryan Matthews,Jarrius Matthews   Account Number:  [id number]  Date Initiated:  03/24/2014  Documentation initiated by:  Edd ArbourGIBBS,KIMBERLY  Subjective/Objective Assessment:   34 yr old self pay c/o Right sided chest pain increasing over last week Pt previously with [employer] managed insurance and no longer has [employer]     Subjective/Objective Assessment Detail:     Action/Plan:   spoke with pt and provided [place] self pay resources see notes below   Action/Plan Detail:   Anticipated DC Date:       Status Recommendation to Physician:   Result of Recommendation:    Other ED Services  Consult Working Psychologist, educationallan    DC Planning Services  Other  Outpatient Services - Pt will follow up  PCP issues    Choice offered to / List presented to:            Status of service:  Completed, signed off  ED Comments:   ED Comments Detail:  CM spoke with pt who [place] pcp. CM discussed and provided written information for self pay pcps, importance of pcp for f/u care, www.needymeds.org, www.goodrx.com, discounted pharmacies and other [place] resources such as [facility], [facility], affordable care act,  financial assistance, DSS and  health department  Reviewed resources for [place] self pay pcps like [facility], family medicine at [address], [facility], general medical clinics, [facility] plus others, medication resources, [facility] out [facility]ies and housing Pt voiced understanding and appreciation of resources provided  Provided [facility] contact information. Agreed to [facility] referral. Referral completed

## 2015-12-03 ENCOUNTER — Emergency Department (HOSPITAL_COMMUNITY)
Admission: EM | Admit: 2015-12-03 | Discharge: 2015-12-03 | Disposition: A | Discharge status: Home / Self Care | Payer: Managed Care, Other (non HMO) | Attending: Emergency Medicine | Admitting: Emergency Medicine

## 2015-12-03 ENCOUNTER — Encounter (HOSPITAL_COMMUNITY): Payer: Self-pay | Admitting: Emergency Medicine

## 2015-12-03 ENCOUNTER — Emergency Department (HOSPITAL_COMMUNITY): Payer: Managed Care, Other (non HMO)

## 2015-12-03 DIAGNOSIS — J069 Acute upper respiratory infection, unspecified: Secondary | ICD-10-CM | POA: Insufficient documentation

## 2015-12-03 DIAGNOSIS — Z7982 Long term (current) use of aspirin: Secondary | ICD-10-CM | POA: Insufficient documentation

## 2015-12-03 DIAGNOSIS — F17200 Nicotine dependence, unspecified, uncomplicated: Secondary | ICD-10-CM | POA: Insufficient documentation

## 2015-12-03 DIAGNOSIS — Z79899 Other long term (current) drug therapy: Secondary | ICD-10-CM | POA: Insufficient documentation

## 2015-12-03 DIAGNOSIS — J45909 Unspecified asthma, uncomplicated: Secondary | ICD-10-CM | POA: Insufficient documentation

## 2015-12-03 DIAGNOSIS — B9789 Other viral agents as the cause of diseases classified elsewhere: Secondary | ICD-10-CM

## 2015-12-03 MED ORDER — BENZONATATE 100 MG PO CAPS: 100.0000 mg | ORAL_CAPSULE | Freq: Three times a day (TID) | ORAL | 0 refills | Status: DC

## 2015-12-03 MED ORDER — ALBUTEROL SULFATE (2.5 MG/3ML) 0.083% IN NEBU
5.0000 mg | INHALATION_SOLUTION | Freq: Once | RESPIRATORY_TRACT | Status: AC
  Administered 2015-12-03: 5 mg via RESPIRATORY_TRACT
  Filled 2015-12-03: qty 6

## 2015-12-03 MED ORDER — IBUPROFEN 800 MG PO TABS
800.0000 mg | ORAL_TABLET | Freq: Once | ORAL | Status: AC
  Administered 2015-12-03: 800 mg via ORAL
  Filled 2015-12-03: qty 1

## 2015-12-03 MED ORDER — IBUPROFEN 600 MG PO TABS: 600.0000 mg | ORAL_TABLET | Freq: Four times a day (QID) | ORAL | 0 refills | Status: DC | PRN

## 2015-12-03 NOTE — ED Triage Notes (Signed)
Pt c/o non productive cough with pain in L side of chest when coughing and deep breathing, movement, and palpation. Pt has hx of asthma, sts he might need breathing treatment. Pt A&Ox4 and ambulatory. NAD noted. Denies SOB. Pt does smoke.

## 2015-12-03 NOTE — ED Provider Notes (Signed)
[facility]EM[facility]ENC[facility] Provider Note   CSN: [id number] Arrival date & time: 12/03/15  1403  By signing my name below, I, Octavia Heirrianna Nassar, attest that this documentation has been prepared under the direction and in the presence of Audry Piliyler Zurri Rudden.  Electronically Signed: Octavia HeirArianna Nassar, ED Scribe. 12/03/15. 2:24 PM.    History   Chief Complaint Chief Complaint  Patient presents with  . Cough    The history is provided by the patient. No language interpreter was used.   HPI Comments: Bryan Matthews is a 35 y.o. male who has a PMhx of ashtma presents to the Emergency Department complaining of a constant, non-productive cough for the past few days that became progressively worse this morning. He reports associated rhinorrhea, sore throat, and left sided rib pain that is worse when palpated and when he coughs. Pt further expresses increased pain when taking a deep breath. He has been around someone in his household who was diagnosed with pneumonia last week. Denies fever, chest pain, nasal congestion, ear pain, recent prolonged travel, hx of DVT, recent surgery, hx of HTN, hx of DM, or family hx of CAD/MI. Pt is not on any anticoagulants. He is a current smoker.   Past Medical History:  Diagnosis Date  . Asthma     There are no active problems to display for this patient.   History reviewed. No pertinent surgical history.     Home Medications    Prior to Admission medications   Medication Sig Start Date End Date Taking? Authorizing Provider  aspirin EC 325 MG tablet Take 325 mg by mouth daily.    Historical Provider, MD  cyclobenzaprine (FLEXERIL) 10 MG tablet Take 1 tablet (10 mg total) by mouth 3 (three) times daily as needed for muscle spasms. Patient not taking: Reported on 03/24/2014 12/23/12   Ivonne AndrewPeter Dammen, PA-C  meloxicam (MOBIC) 15 MG tablet Take 1 tablet (15 mg total) by mouth daily. Patient not taking: Reported on 03/24/2014 12/23/12   Ivonne AndrewPeter Dammen, PA-C  naproxen (NAPROSYN)  500 MG tablet Take 1 tablet (500 mg total) by mouth 2 (two) times daily. 03/24/14   Catha GosselinHanna Patel-Mills, PA-C    Family History No family history on file.  Social History Social History  Substance Use Topics  . Smoking status: Current Every Day Smoker    Packs/day: 1.00  . Smokeless tobacco: Not on file  . Alcohol use 7.2 oz/week    12 Cans of beer per week     Allergies   Patient has no known allergies.   Review of Systems Review of Systems  Constitutional: Negative for fever.  HENT: Positive for rhinorrhea and sore throat. Negative for congestion and ear pain.   Respiratory: Positive for cough.   Cardiovascular: Negative for chest pain.   Physical Exam Updated Vital Signs BP 142/86 (BP Location: Left Arm)   Pulse 88   Temp 98 F (36.7 C) (Oral)   Resp 20   SpO2 98%   Physical Exam  Constitutional: He is oriented to person, place, and time. Vital signs are normal. He appears well-developed and well-nourished.  HENT:  Head: Normocephalic.  Right Ear: Hearing normal.  Left Ear: Hearing normal.  Eyes: Conjunctivae and EOM are normal. Pupils are equal, round, and reactive to light.  Neck: Normal range of motion.  Cardiovascular: Normal rate and regular rhythm.   Pulmonary/Chest: Effort normal. He has wheezes.  TTP to left anterior chest well, no visible or palpable deformities. Wheezing in the right upper and right  lower bases  Abdominal: He exhibits no distension.  Musculoskeletal: Normal range of motion.  Neurological: He is alert and oriented to person, place, and time.  Skin: Skin is warm and dry.  Psychiatric: He has a normal mood and affect. His speech is normal and behavior is normal. Thought content normal.  Nursing note and vitals reviewed.  ED Treatments / Results  DIAGNOSTIC STUDIES: Oxygen Saturation is 98% on RA, normal by my interpretation.  COORDINATION OF CARE:  2:19 PM Will order CXR. Discussed treatment plan which includes breathing treatment  with pt at bedside and pt agreed to plan.  Labs (all labs ordered are listed, but only abnormal results are displayed) Labs Reviewed - No data to display  EKG  EKG Interpretation None      Radiology Dg Chest 2 View  Result Date: 12/03/2015 CLINICAL DATA:  Nonproductive cough and left-sided chest pain. EXAM: CHEST  2 VIEW COMPARISON:  03/24/2014 FINDINGS: Lungs are adequately inflated without consolidation or effusion. No pneumothorax. Cardiomediastinal silhouette in remainder of the exam is within normal. IMPRESSION: No active cardiopulmonary disease. Electronically Signed   By: Elberta Fortisaniel  Boyle M.D.   On: 12/03/2015 14:31    Procedures Procedures (including critical care time)  Medications Ordered in ED Medications  albuterol (PROVENTIL) (2.5 MG/3ML) 0.083% nebulizer solution 5 mg (not administered)     Initial Impression / Assessment and Plan / ED Course  I have reviewed the triage vital signs and the nursing notes.  Pertinent labs & imaging results that were available during my care of the patient were reviewed by me and considered in my medical decision making (see chart for details).  Clinical Course    Final Clinical Impressions(s) / ED Diagnoses  I have reviewed and evaluated the relevant imaging studies.  I have reviewed the relevant previous healthcare records. I obtained HPI from historian.  ED Course:  Assessment: Pt is a 35yM presents with URI symptoms x several days. No fever. Productive cough. Left sided chest discomfort. No hx ACS. No Risk factors other than smoking. Perc negative. On exam, pt in NAD. VSS. Afebrile. Lungs bilateral wheeze, Heart RRR. Chest wall TTP. Likely costochondritis as pain is focal between rib space. No erythema. No visual or palpable deformities. Abdomen nontender/soft. Pt CXR negative for acute infiltrate. Patients symptoms are consistent with URI, likely viral etiology. Discussed that antibiotics are not indicated for viral infections. Pt  will be discharged with symptomatic treatment.  Verbalizes understanding and is agreeable with plan. Pt is hemodynamically stable & in NAD prior to dc.  Disposition/Plan:  DC Home Additional Verbal discharge instructions given and discussed with patient.  Pt Instructed to f/u with PCP in the next week for evaluation and treatment of symptoms. Return precautions given Pt acknowledges and agrees with plan  Supervising Physician Shaune Pollackameron Isaacs, MD   Final diagnoses:  Viral URI with cough    New Prescriptions New Prescriptions   No medications on file     Audry Piliyler Chemeka Filice, PA-C 12/03/15 1447    Shaune Pollackameron Isaacs, MD 12/04/15 1902

## 2015-12-03 NOTE — ED Notes (Signed)
PT DISCHARGED. INSTRUCTIONS AND PRESCRIPTIONS GIVEN. AAOX4. PT IN NO APPARENT DISTRESS. THE OPPORTUNITY TO ASK QUESTIONS WAS PROVIDED. 

## 2015-12-03 NOTE — Discharge Instructions (Signed)
Please read and follow all provided instructions.  Your diagnoses today include:  1. Viral URI with cough     You appear to have an upper respiratory infection (URI). An upper respiratory tract infection, or cold, is a viral infection of the air passages leading to the lungs. It should improve gradually after 5-7 days. You may have a lingering cough that lasts for 2- 4 weeks after the infection.  Tests performed today include: Vital signs. See below for your results today.   Medications prescribed:   Take any prescribed medications only as directed. Treatment for your infection is aimed at treating the symptoms. There are no medications, such as antibiotics, that will cure your infection.   Home care instructions:  Follow any educational materials contained in this packet.   Your illness is contagious and can be spread to others, especially during the first 3 or 4 days. It cannot be cured by antibiotics or other medicines. Take basic precautions such as washing your hands often, covering your mouth when you cough or sneeze, and avoiding public places where you could spread your illness to others.   Please continue drinking plenty of fluids.  Use over-the-counter medicines as needed as directed on packaging for symptom relief.  You may also use ibuprofen or tylenol as directed on packaging for pain or fever.  Do not take multiple medicines containing Tylenol or acetaminophen to avoid taking too much of this medication.  Follow-up instructions: Please follow-up with your primary care provider in the next 3 days for further evaluation of your symptoms if you are not feeling better.   Return instructions:  Please return to the Emergency Department if you experience worsening symptoms.  RETURN IMMEDIATELY IF you develop shortness of breath, confusion or altered mental status, a new rash, become dizzy, faint, or poorly responsive, or are unable to be cared for at home. Please return if you have  persistent vomiting and cannot keep down fluids or develop a fever that is not controlled by tylenol or motrin.   Please return if you have any other emergent concerns.  Additional Information:  Your vital signs today were: BP 142/86 (BP Location: Left Arm)    Pulse 88    Temp 98 F (36.7 C) (Oral)    Resp 20    SpO2 98%  If your blood pressure (BP) was elevated above 135/85 this visit, please have this repeated by your doctor within one month. --------------

## 2016-03-14 ENCOUNTER — Ambulatory Visit (HOSPITAL_COMMUNITY)
Admission: EM | Admit: 2016-03-14 | Discharge: 2016-03-14 | Disposition: A | Discharge status: Home / Self Care | Payer: BLUE CROSS/BLUE SHIELD | Attending: Internal Medicine | Admitting: Internal Medicine

## 2016-03-14 ENCOUNTER — Encounter (HOSPITAL_COMMUNITY): Payer: Self-pay | Admitting: Emergency Medicine

## 2016-03-14 DIAGNOSIS — L0291 Cutaneous abscess, unspecified: Secondary | ICD-10-CM

## 2016-03-14 MED ORDER — ONDANSETRON 4 MG PO TBDP
4.0000 mg | ORAL_TABLET | Freq: Once | ORAL | Status: AC
  Administered 2016-03-14: 4 mg via ORAL

## 2016-03-14 MED ORDER — ONDANSETRON 4 MG PO TBDP
ORAL_TABLET | ORAL | Status: AC
  Filled 2016-03-14: qty 1

## 2016-03-14 MED ORDER — SULFAMETHOXAZOLE-TRIMETHOPRIM 800-160 MG PO TABS: 1.0000 | ORAL_TABLET | Freq: Two times a day (BID) | ORAL | 0 refills | Status: AC

## 2016-03-14 MED ORDER — HYDROCODONE-ACETAMINOPHEN 5-325 MG PO TABS
ORAL_TABLET | ORAL | Status: AC
  Filled 2016-03-14: qty 1

## 2016-03-14 MED ORDER — HYDROCODONE-ACETAMINOPHEN 5-325 MG PO TABS
1.0000 | ORAL_TABLET | Freq: Once | ORAL | Status: AC
  Administered 2016-03-14: 1 via ORAL

## 2016-03-14 MED ORDER — CEFTRIAXONE SODIUM 1 G IJ SOLR
1.0000 g | Freq: Once | INTRAMUSCULAR | Status: AC
  Administered 2016-03-14: 1 g via INTRAMUSCULAR

## 2016-03-14 MED ORDER — LIDOCAINE HCL (PF) 1 % IJ SOLN
INTRAMUSCULAR | Status: AC
  Filled 2016-03-14: qty 2

## 2016-03-14 MED ORDER — ACETAMINOPHEN-CODEINE #3 300-30 MG PO TABS: 1.0000 | ORAL_TABLET | Freq: Three times a day (TID) | ORAL | 0 refills | Status: DC | PRN

## 2016-03-14 MED ORDER — CEFTRIAXONE SODIUM 1 G IJ SOLR
INTRAMUSCULAR | Status: AC
  Filled 2016-03-14: qty 10

## 2016-03-14 NOTE — ED Triage Notes (Signed)
[facility] with a complaint of a possible abscess on the inside of his right leg near his groin.

## 2016-03-14 NOTE — Discharge Instructions (Signed)
Your abscess has been drained tonight. Keep your wound clean, they will drain next 2-3 days. Do sitz baths 15 minutes at a time twice daily. He been given an antibiotic in clinic tonight, and I have called in a prescription for a different antibiotic that covers a different set of bacteria. It Is called Bactrim, take one tablet twice a day for 7 days. I have also prescribed medication for pain, this medicine is a narcotic, it is habit-forming, do not drink any alcohol while taking it, and do not drive. Should your wound fail to heal, follow up with your primary care provider or return to clinic as needed.

## 2016-03-14 NOTE — ED Provider Notes (Signed)
CSN: [id number]     Arrival date & time 03/14/16  1944 History   First MD Initiated Contact with Patient 03/14/16 2002     Chief Complaint  Patient presents with  . Abscess   (Consider location/radiation/quality/duration/timing/severity/associated sxs/prior Treatment) 36 year old male presents to clinic with three-day history of abscess in his groin. States is quite painful. Denies fever, nausea, chills, symptoms of systemic disease.   The history is provided by the patient.    Past Medical History:  Diagnosis Date  . Asthma    History reviewed. No pertinent surgical history. History reviewed. No pertinent family history. Social History  Substance Use Topics  . Smoking status: Current Every Day Smoker    Packs/day: 1.00  . Smokeless tobacco: Not on file  . Alcohol use 7.2 oz/week    12 Cans of beer per week    Review of Systems  Reason unable to perform ROS: As covered in history of present illness.    Allergies  Patient has no known allergies.  Home Medications   Prior to Admission medications   Medication Sig Start Date End Date Taking? Authorizing Provider  acetaminophen-codeine (TYLENOL #3) 300-30 MG tablet Take 1-2 tablets by mouth every 8 (eight) hours as needed for moderate pain. 03/14/16   Dorena Bodo, NP  sulfamethoxazole-trimethoprim (BACTRIM DS,SEPTRA DS) 800-160 MG tablet Take 1 tablet by mouth 2 (two) times daily. 03/14/16 03/21/16  Dorena Bodo, NP   Meds Ordered and Administered this Visit   Medications  HYDROcodone-acetaminophen (NORCO/VICODIN) 5-325 MG per tablet 1 tablet (1 tablet Oral Given 03/14/16 2010)  ondansetron (ZOFRAN-ODT) disintegrating tablet 4 mg (4 mg Oral Given 03/14/16 2009)  cefTRIAXone (ROCEPHIN) injection 1 g (1 g Intramuscular Given 03/14/16 2040)    BP 150/89 (BP Location: Left Arm)   Pulse 103   Temp 98 F (36.7 C) (Oral)   Resp 16   SpO2 99%  No data found.   Physical Exam  Constitutional: He appears well-developed and  well-nourished. No distress.  HENT:  Head: Normocephalic and atraumatic.  Cardiovascular: Normal rate and regular rhythm.   Pulmonary/Chest: Effort normal and breath sounds normal.  Genitourinary: Testes normal and penis normal.    Right testis shows no swelling and no tenderness. Left testis shows no swelling and no tenderness. Circumcised.  Neurological: He is alert.  Skin: Skin is warm and dry. Capillary refill takes less than 2 seconds. He is not diaphoretic.  Psychiatric: He has a normal mood and affect.  Nursing note and vitals reviewed.   Urgent Care Course     .Marland KitchenIncision and Drainage Date/Time: 03/14/2016 8:34 PM Performed by: Dorena Bodo Authorized by: Eustace Moore   Consent:    Consent obtained:  Verbal   Consent given by:  Patient   Risks discussed:  Bleeding, incomplete drainage, pain and infection   Alternatives discussed:  No treatment, delayed treatment, alternative treatment and observation Location:    Type:  Abscess   Size:  3 cm   Location:  Anogenital   Anogenital location:  Perineum Pre-procedure details:    Skin preparation:  Betadine Anesthesia (see MAR for exact dosages):    Anesthesia method:  Local infiltration   Local anesthetic:  Lidocaine 2% WITH epi Procedure type:    Complexity:  Simple Procedure details:    Needle aspiration: yes     Needle size:  22 G   Incision types:  Stab incision   Incision depth:  Dermal   Scalpel blade:  11   Wound management:  Probed and deloculated, irrigated with saline and extensive cleaning   Drainage:  Purulent and bloody   Drainage amount:  Moderate   Wound treatment:  Wound left open   Packing materials:  None Post-procedure details:    Patient tolerance of procedure:  Tolerated well, no immediate complications   (including critical care time)  Labs Review Labs Reviewed - No data to display  Imaging Review No results found.     MDM   1. Abscess   Your abscess has been drained  tonight. Keep your wound clean, they will drain next 2-3 days. Do sitz baths 15 minutes at a time twice daily. He been given an antibiotic in clinic tonight, and I have called in a prescription for a different antibiotic that covers a different set of bacteria. It Is called Bactrim, take one tablet twice a day for 7 days. I have also prescribed medication for pain, this medicine is a narcotic, it is habit-forming, do not drink any alcohol while taking it, and do not drive. Should your wound fail to heal, follow up with your primary care provider or return to clinic as needed.    Dorena BodoLawrence Debie Ashline, NP 03/14/16 2044

## 2016-12-14 DIAGNOSIS — F900 Attention-deficit hyperactivity disorder, predominantly inattentive type: Secondary | ICD-10-CM | POA: Insufficient documentation

## 2016-12-14 DIAGNOSIS — J45909 Unspecified asthma, uncomplicated: Secondary | ICD-10-CM | POA: Insufficient documentation

## 2016-12-14 DIAGNOSIS — F32A Depression, unspecified: Secondary | ICD-10-CM | POA: Insufficient documentation

## 2016-12-14 DIAGNOSIS — Z9109 Other allergy status, other than to drugs and biological substances: Secondary | ICD-10-CM | POA: Insufficient documentation

## 2017-07-22 ENCOUNTER — Encounter (HOSPITAL_COMMUNITY): Payer: Self-pay | Admitting: Emergency Medicine

## 2017-07-22 ENCOUNTER — Telehealth (HOSPITAL_COMMUNITY): Payer: Self-pay | Admitting: *Deleted

## 2017-07-22 ENCOUNTER — Ambulatory Visit (HOSPITAL_COMMUNITY)
Admission: EM | Admit: 2017-07-22 | Discharge: 2017-07-22 | Disposition: A | Discharge status: Home / Self Care | Payer: BLUE CROSS/BLUE SHIELD | Attending: Internal Medicine | Admitting: Internal Medicine

## 2017-07-22 DIAGNOSIS — R51 Headache: Secondary | ICD-10-CM | POA: Diagnosis not present

## 2017-07-22 DIAGNOSIS — R5383 Other fatigue: Secondary | ICD-10-CM

## 2017-07-22 DIAGNOSIS — S0093XA Contusion of unspecified part of head, initial encounter: Secondary | ICD-10-CM | POA: Diagnosis not present

## 2017-07-22 DIAGNOSIS — S0990XA Unspecified injury of head, initial encounter: Secondary | ICD-10-CM

## 2017-07-22 DIAGNOSIS — S0191XA Laceration without foreign body of unspecified part of head, initial encounter: Secondary | ICD-10-CM

## 2017-07-22 MED ORDER — TETANUS-DIPHTH-ACELL PERTUSSIS 5-2.5-18.5 LF-MCG/0.5 IM SUSP: 0.5000 mL | Freq: Once | INTRAMUSCULAR | Status: DC
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
## 2017-07-22 NOTE — Telephone Encounter (Signed)
T/C to pt immediately after discharge; inquired about tetanus status.  Pt states status is unkown; recommended pt return to receive Tdap; pt declines, states he hates needles.  When asked if he understands the risk of getting tetanus disease, states he understands, but still declines.  Marilynn RailN. Burky, NP notified.

## 2018-05-18 ENCOUNTER — Ambulatory Visit
Admission: RE | Admit: 2018-05-18 | Discharge: 2018-05-18 | Disposition: A | Discharge status: Home / Self Care | Payer: BLUE CROSS/BLUE SHIELD | Source: Ambulatory Visit | Attending: Urgent Care | Admitting: Urgent Care

## 2018-05-18 ENCOUNTER — Other Ambulatory Visit: Payer: Self-pay | Admitting: Urgent Care

## 2018-05-18 ENCOUNTER — Other Ambulatory Visit: Payer: Self-pay

## 2018-05-18 DIAGNOSIS — M542 Cervicalgia: Secondary | ICD-10-CM

## 2018-05-18 DIAGNOSIS — M5440 Lumbago with sciatica, unspecified side: Secondary | ICD-10-CM

## 2018-07-14 ENCOUNTER — Other Ambulatory Visit: Payer: Self-pay

## 2018-07-14 ENCOUNTER — Emergency Department (HOSPITAL_COMMUNITY): Payer: BC Managed Care – PPO

## 2018-07-14 ENCOUNTER — Encounter (HOSPITAL_COMMUNITY): Payer: Self-pay

## 2018-07-14 ENCOUNTER — Inpatient Hospital Stay (HOSPITAL_COMMUNITY)
Admission: EM | Admit: 2018-07-14 | Discharge: 2018-07-17 | DRG: 473 | Disposition: A | Discharge status: Home / Self Care | Payer: BC Managed Care – PPO | Attending: Internal Medicine | Admitting: Internal Medicine

## 2018-07-14 DIAGNOSIS — Z419 Encounter for procedure for purposes other than remedying health state, unspecified: Secondary | ICD-10-CM

## 2018-07-14 DIAGNOSIS — G9520 Unspecified cord compression: Secondary | ICD-10-CM | POA: Diagnosis present

## 2018-07-14 DIAGNOSIS — R202 Paresthesia of skin: Secondary | ICD-10-CM | POA: Diagnosis present

## 2018-07-14 DIAGNOSIS — Z1159 Encounter for screening for other viral diseases: Secondary | ICD-10-CM

## 2018-07-14 DIAGNOSIS — M50022 Cervical disc disorder at C5-C6 level with myelopathy: Principal | ICD-10-CM | POA: Diagnosis present

## 2018-07-14 DIAGNOSIS — M6281 Muscle weakness (generalized): Secondary | ICD-10-CM

## 2018-07-14 DIAGNOSIS — M4802 Spinal stenosis, cervical region: Secondary | ICD-10-CM | POA: Diagnosis present

## 2018-07-14 DIAGNOSIS — F1010 Alcohol abuse, uncomplicated: Secondary | ICD-10-CM | POA: Diagnosis present

## 2018-07-14 DIAGNOSIS — F17210 Nicotine dependence, cigarettes, uncomplicated: Secondary | ICD-10-CM | POA: Diagnosis not present

## 2018-07-14 LAB — CBC WITH DIFFERENTIAL/PLATELET
Abs Immature Granulocytes: 0.05 10*3/uL (ref 0.00–0.07)
Basophils Absolute: 0 10*3/uL (ref 0.0–0.1)
Basophils Relative: 0 %
Eosinophils Absolute: 0.1 10*3/uL (ref 0.0–0.5)
Eosinophils Relative: 1 %
HCT: 47.8 % (ref 39.0–52.0)
Hemoglobin: 15.9 g/dL (ref 13.0–17.0)
Immature Granulocytes: 1 %
Lymphocytes Relative: 20 %
Lymphs Abs: 2.2 10*3/uL (ref 0.7–4.0)
MCH: 31.4 pg (ref 26.0–34.0)
MCHC: 33.3 g/dL (ref 30.0–36.0)
MCV: 94.5 fL (ref 80.0–100.0)
Monocytes Absolute: 0.9 10*3/uL (ref 0.1–1.0)
Monocytes Relative: 8 %
Neutro Abs: 7.9 10*3/uL — ABNORMAL HIGH (ref 1.7–7.7)
Neutrophils Relative %: 70 %
Platelets: 248 10*3/uL (ref 150–400)
RBC: 5.06 MIL/uL (ref 4.22–5.81)
RDW: 12.7 % (ref 11.5–15.5)
WBC: 11.1 10*3/uL — ABNORMAL HIGH (ref 4.0–10.5)
nRBC: 0 % (ref 0.0–0.2)

## 2018-07-14 LAB — COMPREHENSIVE METABOLIC PANEL
ALT: 15 U/L (ref 0–44)
AST: 18 U/L (ref 15–41)
Albumin: 4 g/dL (ref 3.5–5.0)
Alkaline Phosphatase: 88 U/L (ref 38–126)
Anion gap: 7 (ref 5–15)
BUN: 10 mg/dL (ref 6–20)
CO2: 27 mmol/L (ref 22–32)
Calcium: 9 mg/dL (ref 8.9–10.3)
Chloride: 104 mmol/L (ref 98–111)
Creatinine, Ser: 0.9 mg/dL (ref 0.61–1.24)
GFR calc Af Amer: >60 mL/min (ref >60)
GFR calc non Af Amer: >60 mL/min (ref >60)
Glucose, Bld: 90 mg/dL (ref 70–99)
Potassium: 3.5 mmol/L (ref 3.5–5.1)
Sodium: 138 mmol/L (ref 135–145)
Total Bilirubin: 0.9 mg/dL (ref 0.3–1.2)
Total Protein: 7.2 g/dL (ref 6.5–8.1)

## 2018-07-14 LAB — SARS CORONAVIRUS 2 BY RT PCR (HOSPITAL ORDER, PERFORMED IN ~~LOC~~ HOSPITAL LAB): SARS Coronavirus 2: NEGATIVE

## 2018-07-14 MED ORDER — GADOBUTROL 1 MMOL/ML IV SOLN
8.0000 mL | Freq: Once | INTRAVENOUS | Status: AC | PRN
  Administered 2018-07-14: 8 mL via INTRAVENOUS

## 2018-07-14 NOTE — ED Notes (Signed)
Patient transported to X-ray and then MRI.  

## 2018-07-14 NOTE — ED Notes (Signed)
Pt reminded of the need for urine. Urinal at bedside.

## 2018-07-14 NOTE — ED Provider Notes (Signed)
Patient presents today in transfer for evaluation of left-sided numbness that began yesterday at 11 when he woke up.  He reports that the night before he had been well.  He does admit to having "a few" alcoholic beverages including 1 beer and "some" Hennessy.  He also reports that he smoked refer.  He clarifies that as cannabis.  He says that it was from a patch that he had not had before, does not know where the cannabis came from.  He denies any syphilis exposure.    He denies family history of MS.  He reports that for the past few months he has had decreased sensation in his left small, ring, and middle finger.  He denies any recent trauma.  No history of injection drug use. Physical Exam  BP (!) 141/86    Pulse 60    Temp 98.1 F (36.7 C) (Oral)    Resp 19    SpO2 98%   Physical Exam Vitals signs and nursing note reviewed.  Constitutional:      General: He is not in acute distress. HENT:     Head: Normocephalic.  Skin:    General: Skin is warm.     Comments: Please see clinical images.  There are darkened areas on his hands and feet bilaterally.   Neurological:     Mental Status: He is alert and oriented to person, place, and time.     Comments: Pupils are equal round reactive to light.  There is no slurred speech or facial droop.  He has decreased sensation through the entirety of the left leg with no proprioception in the left foot.  With dorsiflexion of the left foot he has clonus throughout the leg.  Reflexes are intact to bilateral upper and lower extremities.  He has minimal antigravity movement of his left leg.  His left arm he has decreased sensation in his third and fourth fingers along with decreased left-sided grip strength.  Right sided arm and leg with 5/5 strength, sensation intact.         ED Course/Procedures   Clinical Course as of Jul 13 2321  Sat Jul 14, 2018  2100 Asked charge if his wife could come back as he is agitated, No.    [EH]  2250 Spoke with  neurosurgery Dr. Johnsie Cancelstergaard who request medical admission, anticipate surgery tomorrow.  He says patient does not need any steroids at this time.   [EH]    Clinical Course User Index [EH] Cristina GongHammond, Hibo Blasdell W, PA-C    Procedures   Labs Reviewed  CBC WITH DIFFERENTIAL/PLATELET - Abnormal; Notable for the following components:      Result Value   WBC 11.1 (*)    Neutro Abs 7.9 (*)    All other components within normal limits  SARS CORONAVIRUS 2 (HOSPITAL ORDER, PERFORMED IN [facility])  COMPREHENSIVE METABOLIC PANEL  RPR  RAPID URINE DRUG SCREEN, HOSP PERFORMED    Dg Chest 2 View  Result Date: 07/14/2018 CLINICAL DATA:  Left-sided numbness. EXAM: CHEST - 2 VIEW COMPARISON:  December 03, 2015 FINDINGS: The heart size and mediastinal contours are within normal limits. Both lungs are clear. The visualized skeletal structures are unremarkable. IMPRESSION: No active cardiopulmonary disease. Electronically Signed   By: Gerome Samavid  Williams III M.D   On: 07/14/2018 21:12   Ct Head Wo Contrast  Result Date: 07/14/2018 CLINICAL DATA:  Left-sided numbness. EXAM: CT HEAD WITHOUT CONTRAST TECHNIQUE: Contiguous axial images were obtained from the base of  the skull through the vertex without intravenous contrast. COMPARISON:  None. FINDINGS: Brain: Ventricles and sulci are appropriate for patient's age. No evidence for acute cortically based infarct, intracranial hemorrhage, mass lesion or mass-effect. Vascular: Unremarkable Skull: Intact Sinuses/Orbits: Paranasal sinuses are well aerated. Mastoid air cells are unremarkable. Orbits are unremarkable. Other: None. IMPRESSION: No acute intracranial process. Electronically Signed   By: Annia Beltrew  Davis M.D.   On: 07/14/2018 14:10   Mr Laqueta JeanBrain W And Wo Contrast  Addendum Date: 07/14/2018   ADDENDUM REPORT: 07/14/2018 22:44 ADDENDUM: Study discussed by telephone with PA Lyndel SafeElizabeth Kimberlyann Hollar on 07/14/2018 at 22:43. We agreed on Neurosurgery consultation.  Electronically Signed   By: Odessa FlemingH  Hall M.D.   On: 07/14/2018 22:44   Result Date: 07/14/2018 CLINICAL DATA:  38 year old male who awoke yesterday with left side weakness and numbness. EXAM: MRI HEAD WITHOUT AND WITH CONTRAST MRI CERVICAL SPINE WITHOUT AND WITH CONTRAST TECHNIQUE: Multiplanar, multiecho pulse sequences of the brain and surrounding structures, and cervical spine, to include the craniocervical junction and cervicothoracic junction, were obtained without and with intravenous contrast. CONTRAST:  8 milliliters Gadavist COMPARISON:  Head CT without contrast earlier today. FINDINGS: MRI HEAD FINDINGS Brain: Cerebral volume is within normal limits. No restricted diffusion to suggest acute infarction. No midline shift, mass effect, evidence of mass lesion, ventriculomegaly, extra-axial collection or acute intracranial hemorrhage. Cervicomedullary junction and pituitary are within normal limits. Wallace CullensGray and white matter signal is within normal limits throughout the brain. No encephalomalacia or chronic blood products. No abnormal enhancement identified.  No dural thickening. Vascular: Major intracranial vascular flow voids are preserved. The major dural venous sinuses are enhancing and appear to be patent. Skull and upper cervical spine: Visualized bone marrow signal is within normal limits. Cervical spine is described below. Sinuses/Orbits: Negative orbits. Trace paranasal sinus mucosal thickening. Other: Mastoids are clear. Visible internal auditory structures appear normal. Scalp and face soft tissues appear negative. MRI CERVICAL SPINE FINDINGS Alignment: Straightening and mild reversal of cervical lordosis. No spondylolisthesis. Vertebrae: No marrow edema or evidence of acute osseous abnormality. Visualized bone marrow signal is within normal limits. Cord: Abnormal spinal cord signal at C5-C6 with increased T2 and STIR signal (series 26, image 8). 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 There is also faint post contrast enhancement of the cord just below the maximal level of compression, which is a feature described with compressive myelopathy. No other abnormal intradural enhancement. No dural thickening. Visible spinal cord signal elsewhere is within normal limits. Posterior Fossa, vertebral arteries, paraspinal tissues: Cervicomedullary junction is within normal limits. Brain parenchyma described above. Preserved major vascular flow voids in the neck. Negative neck soft tissues. Negative visible right lung apex. Disc levels: C2-C3:  Negative. C3-C4: Borderline to mild disc bulging with superimposed small central to slightly right paracentral disc protrusion (series 27, image 16 and series 28, image 16. Mild spinal stenosis and ventral cord mass effect. No cord signal abnormality. Borderline to mild right greater than left C4 foraminal stenosis. C4-C5: Mild disc bulging. Mild spinal stenosis which appears in large part congenital due to short pedicle distance. Mild if any spinal cord mass effect and no cord signal abnormality. C5-C6: Bulky left paracentral disc extrusion (series 25, image 8 and series 27, image 27). With moderate spinal stenosis and spinal cord mass effect. Abnormal signal is detailed above. Superimposed disc bulging and endplate spurring. Mild to moderate left and mild right C6 foraminal stenosis. C6-C7:  Minimal disc bulge.  No significant stenosis. C7-T1:  Negative. Negative visible upper thoracic levels. IMPRESSION: 1.  Normal MRI appearance of the brain. 2. Abnormal cervical spine: 3. Bulky disc herniation at C5-C6 eccentric to the left with moderate spinal stenosis, moderate cord mass effect, and abnormal cord signal compatible with edema versus developing myelomalacia secondary to compressive myelopathy. 4. Smaller disc herniation at C3-C4 with up to mild spinal stenosis and cord mass effect. 5. Mild spinal stenosis at C4-C5 related to disc  bulging plus congenital short pedicles. Electronically Signed: By: Odessa FlemingH  Hall M.D. On: 07/14/2018 22:36   Mr Cervical Spine W Or Wo Contrast  Addendum Date: 07/14/2018   ADDENDUM REPORT: 07/14/2018 22:44 ADDENDUM: Study discussed by telephone with PA Lyndel SafeElizabeth Samarrah Tranchina on 07/14/2018 at 22:43. We agreed on Neurosurgery consultation. Electronically Signed   By: Odessa FlemingH  Hall M.D.   On: 07/14/2018 22:44   Result Date: 07/14/2018 CLINICAL DATA:  38 year old male who awoke yesterday with left side weakness and numbness. EXAM: MRI HEAD WITHOUT AND WITH CONTRAST MRI CERVICAL SPINE WITHOUT AND WITH CONTRAST TECHNIQUE: Multiplanar, multiecho pulse sequences of the brain and surrounding structures, and cervical spine, to include the craniocervical junction and cervicothoracic junction, were obtained without and with intravenous contrast. CONTRAST:  8 milliliters Gadavist COMPARISON:  Head CT without contrast earlier today. FINDINGS: MRI HEAD FINDINGS Brain: Cerebral volume is within normal limits. No restricted diffusion to suggest acute infarction. No midline shift, mass effect, evidence of mass lesion, ventriculomegaly, extra-axial collection or acute intracranial hemorrhage. Cervicomedullary junction and pituitary are within normal limits. Wallace CullensGray and white matter signal is within normal limits throughout the brain. No encephalomalacia or chronic blood products. No abnormal enhancement identified.  No dural thickening. Vascular: Major intracranial vascular flow voids are preserved. The major dural venous sinuses are enhancing and appear to be patent. Skull and upper cervical spine: Visualized bone marrow signal is within normal limits. Cervical spine is described below. Sinuses/Orbits: Negative orbits. Trace paranasal sinus mucosal thickening. Other: Mastoids are clear. Visible internal auditory structures appear normal. Scalp and face soft tissues appear negative. MRI CERVICAL SPINE FINDINGS Alignment: Straightening and mild  reversal of cervical lordosis. No spondylolisthesis. Vertebrae: No marrow edema or evidence of acute osseous abnormality. Visualized bone marrow signal is within normal limits. Cord: Abnormal spinal cord signal at C5-C6 with increased T2 and STIR signal (series 26, image 8). 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 There is also faint post contrast enhancement of the cord just below the maximal level of compression, which is a feature described with compressive myelopathy. No other abnormal intradural enhancement. No dural thickening. Visible spinal cord signal elsewhere is within normal limits. Posterior Fossa, vertebral arteries, paraspinal tissues: Cervicomedullary junction is within normal limits. Brain parenchyma described above. Preserved major vascular flow voids in the neck. Negative neck soft tissues. Negative visible right lung apex. Disc levels: C2-C3:  Negative. C3-C4: Borderline to mild disc bulging with superimposed small central to slightly right paracentral disc protrusion (series 27, image 16 and series 28, image 16. Mild spinal stenosis and ventral cord mass effect. No cord signal abnormality. Borderline to mild right greater than left C4 foraminal stenosis. C4-C5: Mild disc bulging. Mild spinal stenosis which appears in large part congenital due to short pedicle distance. Mild if any spinal cord mass effect and no cord signal abnormality. C5-C6: Bulky left paracentral disc extrusion (series 25, image 8 and series 27, image 27). With moderate spinal stenosis and spinal cord mass effect. Abnormal signal is  detailed above. Superimposed disc bulging and endplate spurring. Mild to moderate left and mild right C6 foraminal stenosis. C6-C7:  Minimal disc bulge.  No significant stenosis. C7-T1:  Negative. Negative visible upper thoracic levels. IMPRESSION: 1.  Normal MRI appearance of the brain. 2. Abnormal cervical spine: 3. Bulky disc herniation at C5-C6  eccentric to the left with moderate spinal stenosis, moderate cord mass effect, and abnormal cord signal compatible with edema versus developing myelomalacia secondary to compressive myelopathy. 4. Smaller disc herniation at C3-C4 with up to mild spinal stenosis and cord mass effect. 5. 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 Electronically Signed: By: Genevie Ann M.D. On: 07/14/2018 22:36      MDM   I assumed care of patient in transfer from [facility] for MRI.  MRI was obtained showing a normal-appearing brain with bulky disc herniation at C5-C6 eccentric to the left side with moderate cord mass-effect and abnormal cord signal compatible with edema versus myelomalacia secondary to compressive myelopathy.  He also has disc herniation at C3-C4 with mild spinal stenosis and cord mass-effect.    Of note he also has multiple darkened areas on his palms of his hands and soles of his feet bilaterally.  He does not have any known syphilis exposure however RPR was sent.     I spoke with Dr. Venetia Constable, on-call for neurosurgery who recommends admission for surgery tomorrow.  He states patient does not need any steroids or other specific treatments tonight.  Request medical admission.  Dr. Myna Hidalgo to see patient for medical admission.  NPO at midnight.       Lorin Glass, PA-C 07/15/18 Alyson Ingles    Gareth Morgan, MD 07/20/18 [phone]

## 2018-07-14 NOTE — ED Notes (Signed)
Patient transported to CT 

## 2018-07-14 NOTE — ED Notes (Signed)
[facility] called for transport. 

## 2018-07-14 NOTE — ED Provider Notes (Addendum)
[facility]-EMERGENCY DEPT Provider Note   CSN: [id number] Arrival date & time: 07/14/18  1241    History   Chief Complaint Chief Complaint  Patient presents with  . Numbness    left side    HPI Bryan Matthews is a 38 y.o. male.     HPI Patient states he woke Friday morning with left-sided weakness and numbness.  States he has felt off balance falling to the left several times.  States he is not been able to use his left hand for fine motor skills.  No visual disturbance or speech disturbance.  Denies headache or head trauma.  A couple months ago was seen in urgent care for numbness in his left arm.  Had x-rays of his cervical spine performed and told he had "arthritis".  Patient denies any fever or chills. Past Medical History:  Diagnosis Date  . Asthma     Patient Active Problem List   Diagnosis Date Noted  . Cervical spinal cord compression (HCC) 07/15/2018  . Alcohol abuse   . Tobacco abuse   . 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 ANTERIOR CERVICAL DECOMP/DISCECTOMY FUSION N/A 07/15/2018   Procedure: CERVICAL FIVE-SIX ANTERIOR CERVICAL DECOMPRESSION/DISCECTOMY FUSION;  Surgeon: Jadene Pierinistergard, Thomas A, MD;  Location: [facility] OR;  Service: Neurosurgery;  Laterality: N/A;        Home Medications    Prior to Admission medications   Medication Sig Start Date End Date Taking? Authorizing Provider  cyclobenzaprine (FLEXERIL) 5 MG tablet Take 5 mg by mouth 3 (three) times daily as needed for muscle spasms.  05/18/18  Yes [provider]  acetaminophen (TYLENOL) 325 MG tablet Take 2 tablets (650 mg total) by mouth every 6 (six) hours as needed for mild pain (or Fever >/= 101). 07/17/18   Alwyn RenMathews, Elizabeth G, MD  folic acid (FOLVITE) 1 MG tablet Take 1 tablet (1 mg total) by mouth daily. 07/18/18   Alwyn RenMathews, Elizabeth G, MD  HYDROcodone-acetaminophen (NORCO/VICODIN) 5-325 MG tablet Take 1-2  tablets by mouth every 4 (four) hours as needed for moderate pain. 07/17/18   Alwyn RenMathews, Elizabeth G, MD    Family History History reviewed. No pertinent family history.  Social History Social History   Tobacco Use  . Smoking status: Current Every Day Smoker    Packs/day: 1.00  . Smokeless tobacco: Never Used  Substance Use Topics  . Alcohol use: Yes    Alcohol/week: 12.0 standard drinks    Types: 12 Cans of beer per week  . Drug use: Yes    Frequency: 3.0 times per week    Types: Marijuana     Allergies   Patient has no known allergies.   Review of Systems Review of Systems  Constitutional: Negative for chills and fever.  HENT: Negative for sinus pressure and trouble swallowing.   Eyes: Negative for visual disturbance.  Respiratory: Negative for cough and shortness of breath.   Cardiovascular: Negative for chest pain.  Gastrointestinal: Negative for abdominal pain, constipation, diarrhea, nausea and vomiting.  Musculoskeletal: Positive for gait problem. Negative for back pain, myalgias and neck pain.  Skin: Negative for rash and wound.  Neurological: Positive for weakness and numbness. Negative for dizziness, facial asymmetry, speech difficulty, light-headedness and headaches.  All other systems reviewed and are negative.    Physical Exam Updated Vital Signs BP 129/78 (BP Location: Left Arm)   Pulse 67   Temp 97.9 F (36.6 C) (  Oral)   Resp 18   Ht 5\' 7"  (1.702 m)   Wt 80.7 kg   SpO2 99%   BMI 27.88 kg/m   Physical Exam Vitals signs and nursing note reviewed.  Constitutional:      Appearance: Normal appearance. He is well-developed.  HENT:     Head: Normocephalic and atraumatic.     Comments: No facial asymmetry.    Nose: Nose normal.     Mouth/Throat:     Mouth: Mucous membranes are moist.  Eyes:     Extraocular Movements: Extraocular movements intact.     Pupils: Pupils are equal, round, and reactive to light.  Neck:     Musculoskeletal: Normal range  of motion and neck supple. No neck rigidity or muscular tenderness.  Cardiovascular:     Rate and Rhythm: Normal rate and regular rhythm.     Heart sounds: No murmur. No friction rub. No gallop.   Pulmonary:     Effort: Pulmonary effort is normal. No respiratory distress.     Breath sounds: Normal breath sounds. No stridor. No wheezing, rhonchi or rales.  Chest:     Chest wall: No tenderness.  Abdominal:     General: Bowel sounds are normal.     Palpations: Abdomen is soft.     Tenderness: There is no abdominal tenderness. There is no guarding or rebound.  Musculoskeletal: Normal range of motion.        General: No swelling, tenderness, deformity or signs of injury.     Right lower leg: No edema.     Left lower leg: No edema.  Lymphadenopathy:     Cervical: No cervical adenopathy.  Skin:    General: Skin is warm and dry.     Findings: No erythema or rash.  Neurological:     Mental Status: He is alert and oriented to person, place, and time.     Comments: Cranial nerves II through XII grossly intact.  Speech is clear.  2/5 grip strength left hand.  4/5 motor left deltoid extension.  5/5 motor right grip strength.  5/5 motor right deltoid extension.  2/5 left lower extremity motor.  5/5 right lower extremity motor.  Decreased sensation to light touch in left upper and left lower extremity.  Sensation otherwise intact.  Ataxia with finger-nose testing of the left upper extremity compared to the right.  Psychiatric:        Behavior: Behavior normal.      ED Treatments / Results  Labs (all labs ordered are listed, but only abnormal results are displayed) Labs Reviewed  CBC WITH DIFFERENTIAL/PLATELET - Abnormal; Notable for the following components:      Result Value   WBC 11.1 (*)    Neutro Abs 7.9 (*)    All other components within normal limits  RAPID URINE DRUG SCREEN, HOSP PERFORMED - Abnormal; Notable for the following components:   Opiates POSITIVE (*)    Cocaine POSITIVE  (*)    Benzodiazepines POSITIVE (*)    Tetrahydrocannabinol POSITIVE (*)    All other components within normal limits  BASIC METABOLIC PANEL - Abnormal; Notable for the following components:   Potassium 3.3 (*)    All other components within normal limits  SARS CORONAVIRUS 2 (HOSPITAL ORDER, PERFORMED IN [facility])  COMPREHENSIVE METABOLIC PANEL  RPR  HIV ANTIBODY (ROUTINE TESTING W REFLEX)  CBC    EKG EKG Interpretation  Date/Time:  Saturday July 14 2018 13:40:10 EDT Ventricular Rate:  80 PR  Interval:    QRS Duration: 101 QT Interval:  359 QTC Calculation: 415 R Axis:   107 Text Interpretation:  Sinus rhythm Right axis deviation Borderline T wave abnormalities ST elev, probable normal early repol pattern Confirmed by Loren RacerYelverton, Terin Dierolf ([id number]) on 07/14/2018 2:30:15 PM   Radiology No results found.  Procedures Procedures (including critical care time)  Medications Ordered in ED Medications  sodium chloride 0.45 % 1,000 mL with potassium chloride 10 mEq infusion ( Intravenous Stopping Infusion hung by another clincian 07/15/18 0815)  LORazepam (ATIVAN) tablet 0-4 mg (0 mg Oral Not Given 07/16/18 1742)  HYDROmorphone (DILAUDID) 1 MG/ML injection (has no administration in time range)  gadobutrol (GADAVIST) 1 MMOL/ML injection 8 mL (8 mLs Intravenous Contrast Given 07/14/18 2207)  ceFAZolin (ANCEF) IVPB 2g/100 mL premix (2 g Intravenous Given 07/15/18 1001)  ceFAZolin (ANCEF) 2-4 GM/100ML-% IVPB (  Override pull for Anesthesia 07/15/18 1001)  succinylcholine (ANECTINE) 200 MG/10ML syringe (has no administration in time range)  lidocaine 20 MG/ML injection (has no administration in time range)  rocuronium bromide 100 MG/10ML SOSY (has no administration in time range)  ePHEDrine 5 MG/ML injection (has no administration in time range)  phenylephrine 0.4-0.9 MG/10ML-% injection (has no administration in time range)  dexamethasone (DECADRON) 10 MG/ML injection (has no  administration in time range)  ondansetron (ZOFRAN) 4 MG/2ML injection (has no administration in time range)  propofol (DIPRIVAN) 10 mg/mL bolus/IV push (has no administration in time range)  midazolam (VERSED) 2 MG/2ML injection (has no administration in time range)  fentaNYL (SUBLIMAZE) 250 MCG/5ML injection (has no administration in time range)  bacitracin 500 UNIT/GM ointment (has no administration in time range)  lidocaine-EPINEPHrine (XYLOCAINE W/EPI) 1 %-1:100000 (with pres) injection (has no administration in time range)  thrombin 5000 units spray (has no administration in time range)  fentaNYL (SUBLIMAZE) 250 MCG/5ML injection (has no administration in time range)     Initial Impression / Assessment and Plan / ED Course  I have reviewed the triage vital signs and the nursing notes.  Pertinent labs & imaging results that were available during my care of the patient were reviewed by me and considered in my medical decision making (see chart for details).  Clinical Course as of Aug 04 2130  Sat Jul 14, 2018  2100 Asked charge if his wife could come back as he is agitated, No.    [EH]  2250 Spoke with neurosurgery Dr. Johnsie Cancelstergaard who request medical admission, anticipate surgery tomorrow.  He says patient does not need any steroids at this time.   [EH]    Clinical Course User Index [EH] Cristina GongHammond, Elizabeth W, PA-C       CT head without acute findings.  Discussed with neurology, Dr. Otelia LimesLindzen.  Recommends MRI cervical spine and brain with and without contrast if kidney function is normal.  [facility]  Will accept transfer and will consult neurology if any abnormal findings.  Final Clinical Impressions(s) / ED Diagnoses   Final diagnoses:  Acute left-sided muscle weakness  Compression of spinal cord [facility](HCC)    ED Discharge Orders         Ordered    folic acid (FOLVITE) 1 MG tablet  Daily     07/17/18 1238    acetaminophen  (TYLENOL) 325 MG tablet  Every 6 hours PRN     07/17/18 1238    HYDROcodone-acetaminophen (NORCO/VICODIN) 5-325 MG tablet  Every 4 hours PRN     07/17/18 1238  Increase activity slowly     07/17/18 1240    Diet - low sodium heart healthy     07/17/18 1240    Call MD for:  persistant nausea and vomiting     07/17/18 1240    Call MD for:  temperature >100.4     07/17/18 1240    Increase activity slowly     07/17/18 1242    Diet - low sodium heart healthy     07/17/18 1242           Julianne Rice, MD 07/14/18 1430    Julianne Rice, MD 08/04/18 2132

## 2018-07-14 NOTE — ED Triage Notes (Signed)
Patient c/o of left sided numbness that started yesterday around 11am after waking up.   Patient has been seen for same in past 6 months at urgent care and sent for imaging. 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   Patient states he fell yesterday 3-4 times.     A/ox4 Wheelchair in triage.  Patient states he can walk a little bit by himself but sometimes his steps will cross over and he will get weak and fall.

## 2018-07-14 NOTE — ED Notes (Signed)
EDP at bedside  

## 2018-07-15 ENCOUNTER — Encounter (HOSPITAL_COMMUNITY): Payer: Self-pay | Admitting: Family Medicine

## 2018-07-15 ENCOUNTER — Inpatient Hospital Stay (HOSPITAL_COMMUNITY): Payer: BC Managed Care – PPO | Admitting: Anesthesiology

## 2018-07-15 ENCOUNTER — Inpatient Hospital Stay (HOSPITAL_COMMUNITY): Payer: BC Managed Care – PPO

## 2018-07-15 ENCOUNTER — Encounter (HOSPITAL_COMMUNITY)
Admission: EM | Disposition: A | Discharge status: Home / Self Care | Payer: Self-pay | Source: Home / Self Care | Attending: Internal Medicine

## 2018-07-15 DIAGNOSIS — G9520 Unspecified cord compression: Secondary | ICD-10-CM | POA: Diagnosis present

## 2018-07-15 DIAGNOSIS — Z720 Tobacco use: Secondary | ICD-10-CM | POA: Diagnosis not present

## 2018-07-15 DIAGNOSIS — M50022 Cervical disc disorder at C5-C6 level with myelopathy: Secondary | ICD-10-CM | POA: Diagnosis present

## 2018-07-15 DIAGNOSIS — F1010 Alcohol abuse, uncomplicated: Secondary | ICD-10-CM | POA: Diagnosis present

## 2018-07-15 DIAGNOSIS — Z1159 Encounter for screening for other viral diseases: Secondary | ICD-10-CM | POA: Diagnosis not present

## 2018-07-15 DIAGNOSIS — F17210 Nicotine dependence, cigarettes, uncomplicated: Secondary | ICD-10-CM | POA: Diagnosis present

## 2018-07-15 DIAGNOSIS — M4802 Spinal stenosis, cervical region: Secondary | ICD-10-CM | POA: Diagnosis present

## 2018-07-15 DIAGNOSIS — R202 Paresthesia of skin: Secondary | ICD-10-CM | POA: Diagnosis present

## 2018-07-15 HISTORY — PX: ANTERIOR CERVICAL DECOMP/DISCECTOMY FUSION: SHX1161

## 2018-07-15 LAB — RPR: RPR Ser Ql: NONREACTIVE

## 2018-07-15 LAB — BASIC METABOLIC PANEL
Anion gap: 10 (ref 5–15)
BUN: 8 mg/dL (ref 6–20)
CO2: 27 mmol/L (ref 22–32)
Calcium: 8.9 mg/dL (ref 8.9–10.3)
Chloride: 102 mmol/L (ref 98–111)
Creatinine, Ser: 1 mg/dL (ref 0.61–1.24)
GFR calc Af Amer: >60 mL/min (ref >60)
GFR calc non Af Amer: >60 mL/min (ref >60)
Glucose, Bld: 94 mg/dL (ref 70–99)
Potassium: 3.3 mmol/L — ABNORMAL LOW (ref 3.5–5.1)
Sodium: 139 mmol/L (ref 135–145)

## 2018-07-15 LAB — CBC
HCT: 45.5 % (ref 39.0–52.0)
Hemoglobin: 15.4 g/dL (ref 13.0–17.0)
MCH: 31.2 pg (ref 26.0–34.0)
MCHC: 33.8 g/dL (ref 30.0–36.0)
MCV: 92.3 fL (ref 80.0–100.0)
Platelets: 242 10*3/uL (ref 150–400)
RBC: 4.93 MIL/uL (ref 4.22–5.81)
RDW: 12.7 % (ref 11.5–15.5)
WBC: 9.3 10*3/uL (ref 4.0–10.5)
nRBC: 0 % (ref 0.0–0.2)

## 2018-07-15 SURGERY — ANTERIOR CERVICAL DECOMPRESSION/DISCECTOMY FUSION 1 LEVEL
Anesthesia: General | Site: Spine Cervical

## 2018-07-15 MED ORDER — FENTANYL CITRATE (PF) 100 MCG/2ML IJ SOLN
INTRAMUSCULAR | Status: AC
  Filled 2018-07-15: qty 2

## 2018-07-15 MED ORDER — MENTHOL 3 MG MT LOZG
1.0000 | LOZENGE | OROMUCOSAL | Status: DC | PRN
  Administered 2018-07-15: 3 mg via ORAL
  Filled 2018-07-15: qty 9

## 2018-07-15 MED ORDER — HYDROMORPHONE HCL 1 MG/ML IJ SOLN
INTRAMUSCULAR | Status: AC
  Filled 2018-07-15: qty 1

## 2018-07-15 MED ORDER — MIDAZOLAM HCL 5 MG/5ML IJ SOLN
INTRAMUSCULAR | Status: DC | PRN
  Administered 2018-07-15: 2 mg via INTRAVENOUS

## 2018-07-15 MED ORDER — LIDOCAINE-EPINEPHRINE 1 %-1:100000 IJ SOLN
INTRAMUSCULAR | Status: DC | PRN
  Administered 2018-07-15: 7 mL

## 2018-07-15 MED ORDER — ROCURONIUM BROMIDE 50 MG/5ML IV SOSY
PREFILLED_SYRINGE | INTRAVENOUS | Status: DC | PRN
  Administered 2018-07-15: 80 mg via INTRAVENOUS

## 2018-07-15 MED ORDER — LORAZEPAM 1 MG PO TABS
1.0000 mg | ORAL_TABLET | Freq: Four times a day (QID) | ORAL | Status: DC | PRN
  Filled 2018-07-15: qty 1

## 2018-07-15 MED ORDER — ONDANSETRON HCL 4 MG/2ML IJ SOLN: 4.0000 mg | Freq: Four times a day (QID) | INTRAMUSCULAR | Status: DC | PRN

## 2018-07-15 MED ORDER — FENTANYL CITRATE (PF) 250 MCG/5ML IJ SOLN
INTRAMUSCULAR | Status: AC
  Filled 2018-07-15: qty 5

## 2018-07-15 MED ORDER — SODIUM CHLORIDE 0.9 % IV SOLN
INTRAVENOUS | Status: DC | PRN
  Administered 2018-07-15: 11:00:00 500 mL

## 2018-07-15 MED ORDER — LIP MEDEX EX OINT
TOPICAL_OINTMENT | CUTANEOUS | Status: DC | PRN
  Filled 2018-07-15: qty 7

## 2018-07-15 MED ORDER — MEPERIDINE HCL 25 MG/ML IJ SOLN: 6.2500 mg | INTRAMUSCULAR | Status: DC | PRN

## 2018-07-15 MED ORDER — ONDANSETRON HCL 4 MG/2ML IJ SOLN
INTRAMUSCULAR | Status: AC
  Filled 2018-07-15: qty 2

## 2018-07-15 MED ORDER — FOLIC ACID 1 MG PO TABS
1.0000 mg | ORAL_TABLET | Freq: Every day | ORAL | Status: DC
  Administered 2018-07-16 – 2018-07-17 (×2): 1 mg via ORAL
  Filled 2018-07-15 (×4): qty 1

## 2018-07-15 MED ORDER — SODIUM CHLORIDE 0.45 % IV SOLN
INTRAVENOUS | Status: AC
  Administered 2018-07-15: 02:00:00 via INTRAVENOUS
  Filled 2018-07-15: qty 1000

## 2018-07-15 MED ORDER — 0.9 % SODIUM CHLORIDE (POUR BTL) OPTIME
TOPICAL | Status: DC | PRN
  Administered 2018-07-15: 1000 mL

## 2018-07-15 MED ORDER — LORAZEPAM 1 MG PO TABS: 0.0000 mg | ORAL_TABLET | Freq: Two times a day (BID) | ORAL | Status: DC

## 2018-07-15 MED ORDER — LACTATED RINGERS IV SOLN
INTRAVENOUS | Status: DC
  Administered 2018-07-15 (×2): via INTRAVENOUS

## 2018-07-15 MED ORDER — MORPHINE SULFATE (PF) 4 MG/ML IV SOLN
4.0000 mg | INTRAVENOUS | Status: DC | PRN
  Filled 2018-07-15: qty 1

## 2018-07-15 MED ORDER — EPHEDRINE 5 MG/ML INJ
INTRAVENOUS | Status: AC
  Filled 2018-07-15: qty 10

## 2018-07-15 MED ORDER — CEFAZOLIN SODIUM-DEXTROSE 2-4 GM/100ML-% IV SOLN
2.0000 g | Freq: Once | INTRAVENOUS | Status: AC
  Administered 2018-07-15: 10:00:00 2 g via INTRAVENOUS

## 2018-07-15 MED ORDER — CEFAZOLIN SODIUM-DEXTROSE 2-4 GM/100ML-% IV SOLN
INTRAVENOUS | Status: AC
  Filled 2018-07-15: qty 100

## 2018-07-15 MED ORDER — SUGAMMADEX SODIUM 200 MG/2ML IV SOLN
INTRAVENOUS | Status: DC | PRN
  Administered 2018-07-15: 200 mg via INTRAVENOUS

## 2018-07-15 MED ORDER — FENTANYL CITRATE (PF) 250 MCG/5ML IJ SOLN
INTRAMUSCULAR | Status: DC | PRN
  Administered 2018-07-15: 50 ug via INTRAVENOUS
  Administered 2018-07-15: 25 ug via INTRAVENOUS
  Administered 2018-07-15: 50 ug via INTRAVENOUS
  Administered 2018-07-15: 75 ug via INTRAVENOUS
  Administered 2018-07-15: 100 ug via INTRAVENOUS
  Administered 2018-07-15: 50 ug via INTRAVENOUS

## 2018-07-15 MED ORDER — ACETAMINOPHEN 650 MG RE SUPP: 650.0000 mg | Freq: Four times a day (QID) | RECTAL | Status: DC | PRN

## 2018-07-15 MED ORDER — PROPOFOL 10 MG/ML IV BOLUS
INTRAVENOUS | Status: AC
  Filled 2018-07-15: qty 20

## 2018-07-15 MED ORDER — HYDROMORPHONE HCL 1 MG/ML IJ SOLN
0.2500 mg | INTRAMUSCULAR | Status: DC | PRN
  Administered 2018-07-15 (×3): 0.5 mg via INTRAVENOUS

## 2018-07-15 MED ORDER — LIDOCAINE-EPINEPHRINE 1 %-1:100000 IJ SOLN
INTRAMUSCULAR | Status: AC
  Filled 2018-07-15: qty 1

## 2018-07-15 MED ORDER — POLYETHYLENE GLYCOL 3350 17 G PO PACK: 17.0000 g | PACK | Freq: Every day | ORAL | Status: DC | PRN

## 2018-07-15 MED ORDER — HYDROMORPHONE HCL 1 MG/ML IJ SOLN
INTRAMUSCULAR | Status: AC
  Administered 2018-07-15: 0.5 mg via INTRAVENOUS
  Filled 2018-07-15: qty 1

## 2018-07-15 MED ORDER — ACETAMINOPHEN 325 MG PO TABS: 650.0000 mg | ORAL_TABLET | Freq: Four times a day (QID) | ORAL | Status: DC | PRN

## 2018-07-15 MED ORDER — THROMBIN 5000 UNITS EX SOLR
CUTANEOUS | Status: AC
  Filled 2018-07-15: qty 5000

## 2018-07-15 MED ORDER — LIDOCAINE 2% (20 MG/ML) 5 ML SYRINGE
INTRAMUSCULAR | Status: AC
  Filled 2018-07-15: qty 5

## 2018-07-15 MED ORDER — BACITRACIN ZINC 500 UNIT/GM EX OINT
TOPICAL_OINTMENT | CUTANEOUS | Status: AC
  Filled 2018-07-15: qty 28.35

## 2018-07-15 MED ORDER — PHENOL 1.4 % MT LIQD
1.0000 | OROMUCOSAL | Status: DC | PRN
  Filled 2018-07-15: qty 177

## 2018-07-15 MED ORDER — LORAZEPAM 2 MG/ML IJ SOLN: 1.0000 mg | Freq: Four times a day (QID) | INTRAMUSCULAR | Status: DC | PRN

## 2018-07-15 MED ORDER — ONDANSETRON HCL 4 MG PO TABS: 4.0000 mg | ORAL_TABLET | Freq: Four times a day (QID) | ORAL | Status: DC | PRN

## 2018-07-15 MED ORDER — VITAMIN B-1 100 MG PO TABS
100.0000 mg | ORAL_TABLET | Freq: Every day | ORAL | Status: DC
  Administered 2018-07-16 – 2018-07-17 (×2): 100 mg via ORAL
  Filled 2018-07-15 (×3): qty 1

## 2018-07-15 MED ORDER — PHENYLEPHRINE 40 MCG/ML (10ML) SYRINGE FOR IV PUSH (FOR BLOOD PRESSURE SUPPORT)
PREFILLED_SYRINGE | INTRAVENOUS | Status: AC
  Filled 2018-07-15: qty 10

## 2018-07-15 MED ORDER — THROMBIN 5000 UNITS EX SOLR
OROMUCOSAL | Status: DC | PRN
  Administered 2018-07-15: 5 mL via TOPICAL

## 2018-07-15 MED ORDER — LORAZEPAM 1 MG PO TABS
0.0000 mg | ORAL_TABLET | Freq: Four times a day (QID) | ORAL | Status: AC
  Administered 2018-07-15: 1 mg via ORAL

## 2018-07-15 MED ORDER — MIDAZOLAM HCL 2 MG/2ML IJ SOLN
INTRAMUSCULAR | Status: AC
  Filled 2018-07-15: qty 2

## 2018-07-15 MED ORDER — ONDANSETRON HCL 4 MG/2ML IJ SOLN
INTRAMUSCULAR | Status: DC | PRN
  Administered 2018-07-15: 4 mg via INTRAVENOUS

## 2018-07-15 MED ORDER — ONDANSETRON HCL 4 MG/2ML IJ SOLN: 4.0000 mg | Freq: Once | INTRAMUSCULAR | Status: DC | PRN

## 2018-07-15 MED ORDER — DEXAMETHASONE SODIUM PHOSPHATE 10 MG/ML IJ SOLN
INTRAMUSCULAR | Status: AC
  Filled 2018-07-15: qty 2

## 2018-07-15 MED ORDER — DEXAMETHASONE SODIUM PHOSPHATE 10 MG/ML IJ SOLN
INTRAMUSCULAR | Status: DC | PRN
  Administered 2018-07-15: 10 mg via INTRAVENOUS

## 2018-07-15 MED ORDER — ROCURONIUM BROMIDE 10 MG/ML (PF) SYRINGE
PREFILLED_SYRINGE | INTRAVENOUS | Status: AC
  Filled 2018-07-15: qty 10

## 2018-07-15 MED ORDER — LIDOCAINE 2% (20 MG/ML) 5 ML SYRINGE
INTRAMUSCULAR | Status: DC | PRN
  Administered 2018-07-15: 100 mg via INTRAVENOUS

## 2018-07-15 MED ORDER — HYDROCODONE-ACETAMINOPHEN 5-325 MG PO TABS
1.0000 | ORAL_TABLET | ORAL | Status: DC | PRN
  Administered 2018-07-15 – 2018-07-16 (×5): 2 via ORAL
  Administered 2018-07-16: 1 via ORAL
  Administered 2018-07-16 – 2018-07-17 (×3): 2 via ORAL
  Filled 2018-07-15 (×7): qty 2
  Filled 2018-07-15: qty 1
  Filled 2018-07-15: qty 2

## 2018-07-15 MED ORDER — SUCCINYLCHOLINE CHLORIDE 200 MG/10ML IV SOSY
PREFILLED_SYRINGE | INTRAVENOUS | Status: AC
  Filled 2018-07-15: qty 10

## 2018-07-15 MED ORDER — NICOTINE 14 MG/24HR TD PT24
14.0000 mg | MEDICATED_PATCH | Freq: Every day | TRANSDERMAL | Status: DC
  Administered 2018-07-15: 14 mg via TRANSDERMAL
  Filled 2018-07-15: qty 1

## 2018-07-15 MED ORDER — ADULT MULTIVITAMIN W/MINERALS CH
1.0000 | ORAL_TABLET | Freq: Every day | ORAL | Status: DC
  Administered 2018-07-16 – 2018-07-17 (×2): 1 via ORAL
  Filled 2018-07-15 (×3): qty 1

## 2018-07-15 MED ORDER — ZOLPIDEM TARTRATE 5 MG PO TABS
5.0000 mg | ORAL_TABLET | Freq: Every evening | ORAL | Status: DC | PRN
  Administered 2018-07-15 – 2018-07-16 (×2): 5 mg via ORAL
  Filled 2018-07-15 (×2): qty 1

## 2018-07-15 MED ORDER — THIAMINE HCL 100 MG/ML IJ SOLN: 100.0000 mg | Freq: Every day | INTRAMUSCULAR | Status: DC

## 2018-07-15 MED ORDER — PROPOFOL 10 MG/ML IV BOLUS
INTRAVENOUS | Status: DC | PRN
  Administered 2018-07-15: 150 mg via INTRAVENOUS

## 2018-07-15 SURGICAL SUPPLY — 54 items
ADH SKN CLS APL DERMABOND .7 (GAUZE/BANDAGES/DRESSINGS) ×2
APL SKNCLS STERI-STRIP NONHPOA (GAUZE/BANDAGES/DRESSINGS)
BAG DECANTER FOR FLEXI CONT (MISCELLANEOUS) ×4 IMPLANT
BENZOIN TINCTURE PRP APPL 2/3 (GAUZE/BANDAGES/DRESSINGS) IMPLANT
BLADE CLIPPER SURG (BLADE) IMPLANT
BLADE SURG 11 STRL SS (BLADE) ×4 IMPLANT
BUR MATCHSTICK NEURO 3.0 LAGG (BURR) ×4 IMPLANT
CANISTER SUCT 3000ML PPV (MISCELLANEOUS) ×4 IMPLANT
COVER WAND RF STERILE (DRAPES) ×4 IMPLANT
DERMABOND ADVANCED (GAUZE/BANDAGES/DRESSINGS) ×2
DERMABOND ADVANCED .7 DNX12 (GAUZE/BANDAGES/DRESSINGS) ×2 IMPLANT
DRAPE C-ARM 42X72 X-RAY (DRAPES) ×8 IMPLANT
DRAPE HALF SHEET 40X57 (DRAPES) IMPLANT
DRAPE LAPAROTOMY 100X72 PEDS (DRAPES) ×4 IMPLANT
DRAPE MICROSCOPE LEICA (MISCELLANEOUS) ×4 IMPLANT
DURAPREP 6ML APPLICATOR 50/CS (WOUND CARE) ×4 IMPLANT
ELECT COATED BLADE 2.86 ST (ELECTRODE) ×4 IMPLANT
ELECT REM PT RETURN 9FT ADLT (ELECTROSURGICAL) ×4
ELECTRODE REM PT RTRN 9FT ADLT (ELECTROSURGICAL) ×2 IMPLANT
GAUZE 4X4 16PLY RFD (DISPOSABLE) IMPLANT
GLOVE BIO SURGEON STRL SZ7.5 (GLOVE) ×4 IMPLANT
GLOVE BIOGEL PI IND STRL 7.5 (GLOVE) ×4 IMPLANT
GLOVE BIOGEL PI INDICATOR 7.5 (GLOVE) ×4
GLOVE EXAM NITRILE LRG STRL (GLOVE) IMPLANT
GLOVE EXAM NITRILE XL STR (GLOVE) IMPLANT
GLOVE EXAM NITRILE XS STR PU (GLOVE) IMPLANT
GOWN STRL REUS W/ TWL LRG LVL3 (GOWN DISPOSABLE) ×4 IMPLANT
GOWN STRL REUS W/ TWL XL LVL3 (GOWN DISPOSABLE) IMPLANT
GOWN STRL REUS W/TWL 2XL LVL3 (GOWN DISPOSABLE) IMPLANT
GOWN STRL REUS W/TWL LRG LVL3 (GOWN DISPOSABLE) ×8
GOWN STRL REUS W/TWL XL LVL3 (GOWN DISPOSABLE)
HEMOSTAT POWDER KIT SURGIFOAM (HEMOSTASIS) ×4 IMPLANT
KIT BASIN OR (CUSTOM PROCEDURE TRAY) ×4 IMPLANT
KIT TURNOVER KIT B (KITS) ×4 IMPLANT
NDL SPNL 18GX3.5 QUINCKE PK (NEEDLE) ×1 IMPLANT
NEEDLE HYPO 22GX1.5 SAFETY (NEEDLE) ×4 IMPLANT
NEEDLE SPNL 18GX3.5 QUINCKE PK (NEEDLE) ×4 IMPLANT
NS IRRIG 1000ML POUR BTL (IV SOLUTION) ×4 IMPLANT
PACK LAMINECTOMY NEURO (CUSTOM PROCEDURE TRAY) ×4 IMPLANT
PAD ARMBOARD 7.5X6 YLW CONV (MISCELLANEOUS) ×12 IMPLANT
PIN DISTRACTION 14MM (PIN) IMPLANT
PLATE 23MM (Plate) ×3 IMPLANT
RUBBERBAND STERILE (MISCELLANEOUS) ×8 IMPLANT
SCREW SELF TAP VAR 4.0X13 (Screw) ×12 IMPLANT
SPACER BONE CORNERSTONE 7X14 (Orthopedic Implant) ×3 IMPLANT
SPONGE INTESTINAL PEANUT (DISPOSABLE) ×4 IMPLANT
SPONGE SURGIFOAM ABS GEL SZ50 (HEMOSTASIS) IMPLANT
STAPLER VISISTAT 35W (STAPLE) IMPLANT
SUT MNCRL AB 3-0 PS2 18 (SUTURE) ×4 IMPLANT
SUT VIC AB 3-0 SH 8-18 (SUTURE) ×7 IMPLANT
TAPE CLOTH 3X10 TAN LF (GAUZE/BANDAGES/DRESSINGS) ×4 IMPLANT
TOWEL GREEN STERILE (TOWEL DISPOSABLE) ×4 IMPLANT
TOWEL GREEN STERILE FF (TOWEL DISPOSABLE) ×4 IMPLANT
WATER STERILE IRR 1000ML POUR (IV SOLUTION) ×4 IMPLANT

## 2018-07-15 NOTE — Anesthesia Postprocedure Evaluation (Signed)
Anesthesia Post Note  Patient: Bryan Matthews  Procedure(s) Performed: CERVICAL FIVE-SIX ANTERIOR CERVICAL DECOMPRESSION/DISCECTOMY FUSION (N/A Spine Cervical)     Patient location during evaluation: PACU Anesthesia Type: General Level of consciousness: awake and alert Pain management: pain level controlled Vital Signs Assessment: post-procedure vital signs reviewed and stable Respiratory status: spontaneous breathing, nonlabored ventilation, respiratory function stable and patient connected to nasal cannula oxygen Cardiovascular status: blood pressure returned to baseline and stable Postop Assessment: no apparent nausea or vomiting Anesthetic complications: no    Last Vitals:  Vitals:   07/15/18 1320 07/15/18 1639  BP: (!) 149/93 (!) 157/92  Pulse: 68 61  Resp: 17 16  Temp: (!) 36.2 C 36.6 C  SpO2: 96% 100%    Last Pain:  Vitals:   07/15/18 1711  TempSrc:   PainSc: 3                  Sindhu Nguyen [name]

## 2018-07-15 NOTE — Plan of Care (Signed)
38 yo man admitted with left upper extremity and left lower extremity weakness since Friday that is 2 days prior to admission to the hospital.  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  Patient to go to the OR today.  Continues to have weakness of the left upper and left lower extremity.

## 2018-07-15 NOTE — Consult Note (Signed)
Neurosurgery Consultation  Reason for Consult: Spinal cord injury Referring Physician: Zigmund Daniel  CC: left sided weakness  HPI: This is a 38 y.o. man w/ h/o heavy EtOH use, +smoker, chronic neck pain, with progressive L sided numbness and weakness. Two days ago, he had acute worsening of his left sided weakness, which prompted him to come to the ED last night. No associated or known trauma. He has been having progressive issues with balance for the past few weeks. His numbness is in the LUE, distal to the elbow including the entire hand. It is diffuse in the LLE but worst in the foot.    ROS: A 14 point ROS was performed and is negative except as noted in the HPI.   PMHx:  Past Medical History:  Diagnosis Date  . Asthma    FamHx: History reviewed. No pertinent family history. SocHx:  reports that he has been smoking. He has been smoking about 1.00 pack per day. He has never used smokeless tobacco. He reports current alcohol use of about 12.0 standard drinks of alcohol per week. He reports current drug use. Frequency: 3.00 times per week. Drug: Marijuana.  Exam: Vital signs in last 24 hours: Temp:  [98.1 F (36.7 C)-98.5 F (36.9 C)] 98.5 F (36.9 C) (07/05 0100) Pulse Rate:  [60-104] 74 (07/05 0100) Resp:  [15-19] 18 (07/05 0100) BP: (119-151)/(66-107) 119/66 (07/05 0100) SpO2:  [97 %-100 %] 100 % (07/05 0100) General: Awake, alert, cooperative, lying in bed in NAD Head: normocephalic and atruamatic HEENT: neck supple Pulmonary: breathing room air comfortably, no evidence of increased work of breathing Cardiac: RRR Abdomen: S NT ND Extremities: warm and well perfused x4 Neuro: AOx3, PERRL, EOMI, face symmetric with symmetric facial sensation Strength 5/5 on R, 4-/5 on left, SILT on R, L C5 sensory level, no hoffman's, mildly increased tone in the LLE   Assessment and Plan: 38 y.o. man with acute LUE/LLE weakness. MRI brain & C-spine personally reviewed. 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  -OR today for C5-6 ACDF  Judith Part, MD 07/15/18 4:55 AM [facility]

## 2018-07-15 NOTE — Anesthesia Procedure Notes (Signed)
Procedure Name: Intubation Date/Time: 07/15/2018 9:57 AM Performed by: Shirlyn Goltz, CRNA Pre-anesthesia Checklist: Patient identified, Emergency Drugs available, Suction available and Patient being monitored Patient Re-evaluated:Patient Re-evaluated prior to induction Oxygen Delivery Method: Circle system utilized Preoxygenation: Pre-oxygenation with 100% oxygen Induction Type: IV induction Ventilation: Mask ventilation without difficulty Laryngoscope Size: Mac and 4 Grade View: Grade II Tube type: Oral Tube size: 7.5 mm Number of attempts: 1 Airway Equipment and Method: Stylet Placement Confirmation: ETT inserted through vocal cords under direct vision,  positive ETCO2 and breath sounds checked- equal and bilateral Secured at: 23 cm Tube secured with: Tape Dental Injury: Teeth and Oropharynx as per pre-operative assessment

## 2018-07-15 NOTE — Anesthesia Preprocedure Evaluation (Signed)
Anesthesia Evaluation  Patient identified by MRN, date of birth, ID band Patient awake    Reviewed: Allergy & Precautions, NPO status , Patient's Chart, lab work & pertinent test results  Airway Mallampati: I  TM Distance: >3 FB Neck ROM: Full    Dental   Pulmonary asthma , Current Smoker,    Pulmonary exam normal        Cardiovascular Normal cardiovascular exam     Neuro/Psych    GI/Hepatic   Endo/Other    Renal/GU      Musculoskeletal   Abdominal   Peds  Hematology   Anesthesia Other Findings   Reproductive/Obstetrics                             Anesthesia Physical Anesthesia Plan  ASA: II  Anesthesia Plan: General   Post-op Pain Management:    Induction: Intravenous  PONV Risk Score and Plan: 1 and Ondansetron and Treatment may vary due to age or medical condition  Airway Management Planned: Oral ETT  Additional Equipment:   Intra-op Plan:   Post-operative Plan: Extubation in OR  Informed Consent: I have reviewed the patients History and Physical, chart, labs and discussed the procedure including the risks, benefits and alternatives for the proposed anesthesia with the patient or authorized representative who has indicated his/her understanding and acceptance.       Plan Discussed with: CRNA and Surgeon  Anesthesia Plan Comments:         Anesthesia Quick Evaluation

## 2018-07-15 NOTE — Transfer of Care (Signed)
Immediate Anesthesia Transfer of Care Note  Patient: Bryan Matthews  Procedure(s) Performed: CERVICAL FIVE-SIX ANTERIOR CERVICAL DECOMPRESSION/DISCECTOMY FUSION (N/A Spine Cervical)  Patient Location: PACU  Anesthesia Type:General  Level of Consciousness: awake, alert , oriented and patient cooperative  Airway & Oxygen Therapy: Patient Spontanous Breathing and Patient connected to nasal cannula oxygen  Post-op Assessment: Report given to RN and Post -op Vital signs reviewed and stable  Post vital signs: Reviewed and stable  Last Vitals:  Vitals Value Taken Time  BP 142/86 07/15/18 1214  Temp    Pulse 72 07/15/18 1215  Resp 15 07/15/18 1215  SpO2 97 % 07/15/18 1215  Vitals shown include unvalidated device data.  Last Pain:  Vitals:   07/15/18 0100  TempSrc: Oral  PainSc:          Complications: No apparent anesthesia complications

## 2018-07-15 NOTE — H&P (Signed)
History and Physical    Bryan BlazerBrenton E Matthews OZH:[id number] DOB: 12-Jan-1980 DOA: 07/14/2018  PCP: Wallis BambergMani, Mario, PA-C   Patient coming from: Home   Chief Complaint: Left-sided weakness and numbness   HPI: Bryan Matthews is a 38 y.o. male with medical history significant for childhood asthma, tobacco abuse, and excessive daily alcohol use, now presenting to the emergency department for evaluation of left-sided numbness and weakness.  Patient reports a long history of chronic neck pain that has not limited his activity, but several months ago, he developed numbness and weakness involving the left arm and leg that prompted him to seek evaluation at an outpatient clinic where he had imaging and was told that he had arthritis and a pinched nerve.  Symptoms had improved and he was able to go back to his usual activities, but continued to have some numbness and weakness.  When he woke the morning of 07/13/2018, he noted a marked increase in numbness and weakness involving both upper and lower extremities on the left.  He reports falling a few times due to this and has been unable to use his left hand as usual.  He denies any recent injury or trauma.  He denies any fevers, chills, cough, shortness of breath, or sick contacts.  He reports smoking 1/2 pack/day but often thinks about quitting.  He reports drinking 2-6 alcoholic beverages every day, occasionally more, denies history of withdrawal but drinks every day. He has not needed an inhaler in 15 or 20 years. He apologizes about being rude with ED personnel earlier, explains that he was very frustrated due to his newly disabling neurologic deficits.   ED Course: Upon arrival to the ED, patient is found to be afebrile, saturating well on room air, and with stable blood pressure.  EKG features a sinus rhythm with right axis deviation and nonspecific ST-T abnormality.  Chest x-ray is negative for acute cardiopulmonary disease and noncontrast head CT is negative for acute  intracranial underbelly.  Chemistry panel is unremarkable and CBC notable for mild leukocytosis.  COVID-19 testing is negative.  Neurology was consulted by the ED physician and recommended MRI brain and cervical spine with and without contrast which is notable for normal brain but bulky disc herniation at C5-6 with cord mass-effect, and smaller disc herniations at C3-4 and mild spinal stenosis at C4-5.  Neurosurgery was consulted by the ED physician who anticipates surgical management of this and recommends a medical admission to the hospital.  Review of Systems:  All other systems reviewed and apart from HPI, are negative.  Past Medical History:  Diagnosis Date   Asthma     History reviewed. No pertinent surgical history.   reports that he has been smoking. He has been smoking about 1.00 pack per day. He has never used smokeless tobacco. He reports current alcohol use of about 12.0 standard drinks of alcohol per week. He reports current drug use. Frequency: 3.00 times per week. Drug: Marijuana.  No Known Allergies  History reviewed. No pertinent family history.   Prior to Admission medications   Medication Sig Start Date End Date Taking? Authorizing Provider  cyclobenzaprine (FLEXERIL) 5 MG tablet Take 5 mg by mouth 3 (three) times daily as needed for muscle spasms.  05/18/18  Yes [provider]  ibuprofen (ADVIL) 200 MG tablet Take 400 mg by mouth every 6 (six) hours as needed (for pain).   Yes [provider]  acetaminophen-codeine (TYLENOL #3) 300-30 MG tablet Take 1-2 tablets by mouth  every 8 (eight) hours as needed for moderate pain. Patient not taking: Reported on 07/22/2017 03/14/16   Dorena BodoKennard, Lawrence, NP    Physical Exam: Vitals:   07/14/18 2000 07/14/18 2233 07/14/18 2315 07/15/18 0000  BP: (!) 138/92 (!) 136/94  (!) 151/88  Pulse: 69 72 87 81  Resp:      Temp:      TempSrc:      SpO2: 100% 99% 100% 99%    Constitutional: NAD, calm  Eyes: PERTLA, lids  and conjunctivae normal ENMT: Mucous membranes are moist. Posterior pharynx clear of any exudate or lesions.   Neck: normal, supple, no masses, no thyromegaly Respiratory: clear to auscultation bilaterally, no wheezing, no crackles. No accessory muscle use.  Cardiovascular: S1 & S2 heard, regular rate and rhythm, no significant murmurs / rubs / gallops. No extremity edema.   Abdomen: No distension, no tenderness, soft. Bowel sounds active.  Musculoskeletal: no clubbing / cyanosis. No joint deformity upper and lower extremities.    Skin: no significant rashes, lesions, ulcers. Warm, dry, well-perfused. Neurologic: CN 2-12 grossly intact. Sensation to light touch diminished throughout LUE and LLE. Strength 3/5 in distal LUE, otherwise 5/5.  Psychiatric: Alert and oriented x 3. Calm, cooperative.     Labs on Admission: I have personally reviewed following labs and imaging studies  CBC: Recent Labs  Lab 07/14/18 1420  WBC 11.1*  NEUTROABS 7.9*  HGB 15.9  HCT 47.8  MCV 94.5  PLT 248   Basic Metabolic Panel: Recent Labs  Lab 07/14/18 1420  NA 138  K 3.5  CL 104  CO2 27  GLUCOSE 90  BUN 10  CREATININE 0.90  CALCIUM 9.0   GFR: CrCl cannot be calculated (Unknown ideal weight.). Liver Function Tests: Recent Labs  Lab 07/14/18 1420  AST 18  ALT 15  ALKPHOS 88  BILITOT 0.9  PROT 7.2  ALBUMIN 4.0   No results for input(s): LIPASE, AMYLASE in the last 168 hours. No results for input(s): AMMONIA in the last 168 hours. Coagulation Profile: No results for input(s): INR, PROTIME in the last 168 hours. Cardiac Enzymes: No results for input(s): CKTOTAL, CKMB, CKMBINDEX, TROPONINI in the last 168 hours. BNP (last 3 results) No results for input(s): PROBNP in the last 8760 hours. HbA1C: No results for input(s): HGBA1C in the last 72 hours. CBG: No results for input(s): GLUCAP in the last 168 hours. Lipid Profile: No results for input(s): CHOL, HDL, LDLCALC, TRIG, CHOLHDL,  LDLDIRECT in the last 72 hours. Thyroid Function Tests: No results for input(s): TSH, T4TOTAL, FREET4, T3FREE, THYROIDAB in the last 72 hours. Anemia Panel: No results for input(s): VITAMINB12, FOLATE, FERRITIN, TIBC, IRON, RETICCTPCT in the last 72 hours. Urine analysis:    Component Value Date/Time   COLORURINE YELLOW 12/02/2010 1200   APPEARANCEUR CLEAR 12/02/2010 1200   LABSPEC 1.019 12/02/2010 1200   PHURINE 6.0 12/02/2010 1200   GLUCOSEU NEGATIVE 12/02/2010 1200   HGBUR NEGATIVE 12/02/2010 1200   BILIRUBINUR NEGATIVE 12/02/2010 1200   KETONESUR NEGATIVE 12/02/2010 1200   PROTEINUR NEGATIVE 12/02/2010 1200   UROBILINOGEN 0.2 12/02/2010 1200   NITRITE NEGATIVE 12/02/2010 1200   LEUKOCYTESUR NEGATIVE 12/02/2010 1200   Sepsis Labs: @LABRCNTIP (procalcitonin:4,lacticidven:4) ) Recent Results (from the past 240 hour(s))  SARS Coronavirus 2 (CEPHEID - Performed [facility]), Hosp Order     Status: None   Collection Time: 07/14/18  5:59 PM   Specimen: Nasopharyngeal Swab  Result Value Ref Range Status   SARS Coronavirus  2 NEGATIVE NEGATIVE Final    Comment: (NOTE) If result is NEGATIVE SARS-CoV-2 target nucleic acids are NOT DETECTED. The SARS-CoV-2 RNA is generally detectable in upper and lower  respiratory specimens during the acute phase of infection. The lowest  concentration of SARS-CoV-2 viral copies this assay can detect is 250  copies / mL. A negative result does not preclude SARS-CoV-2 infection  and should not be used as the sole basis for treatment or other  patient management decisions.  A negative result may occur with  improper specimen collection / handling, submission of specimen other  than nasopharyngeal swab, presence of viral mutation(s) within the  areas targeted by this assay, and inadequate number of viral copies  (<250 copies / mL). A negative result must be combined with clinical  observations, patient history, and epidemiological  information. If result is POSITIVE SARS-CoV-2 target nucleic acids are DETECTED. The SARS-CoV-2 RNA is generally detectable in upper and lower  respiratory specimens dur ing the acute phase of infection.  Positive  results are indicative of active infection with SARS-CoV-2.  Clinical  correlation with patient history and other diagnostic information is  necessary to determine patient infection status.  Positive results do  not rule out bacterial infection or co-infection with other viruses. If result is PRESUMPTIVE POSTIVE SARS-CoV-2 nucleic acids MAY BE PRESENT.   A presumptive positive result was obtained on the submitted specimen  and confirmed on repeat testing.  While 2019 novel coronavirus  (SARS-CoV-2) nucleic acids may be present in the submitted sample  additional confirmatory testing may be necessary for epidemiological  and / or clinical management purposes  to differentiate between  SARS-CoV-2 and other Sarbecovirus currently known to infect humans.  If clinically indicated additional testing with an alternate test  methodology [phone]) is advised. The SARS-CoV-2 RNA is generally  detectable in upper and lower respiratory sp ecimens during the acute  phase of infection. The expected result is Negative. Fact Sheet for Patients:  StrictlyIdeas.no Fact Sheet for Healthcare Providers: BankingDealers.co.za This test is not yet approved or cleared by the [place] FDA and has been authorized for detection and/or diagnosis of SARS-CoV-2 by FDA under an Emergency Use Authorization (EUA).  This EUA will remain in effect (meaning this test can be used) for the duration of the COVID-19 declaration under Section 564(b)(1) of the Act, 21 U.S.C. section 360bbb-3(b)(1), unless the authorization is terminated or revoked sooner. Performed at [facility], [place] [address], [place], [place] [address]      Radiological Exams on  Admission: Dg Chest 2 View  Result Date: 07/14/2018 CLINICAL DATA:  Left-sided numbness. EXAM: CHEST - 2 VIEW COMPARISON:  December 03, 2015 FINDINGS: The heart size and mediastinal contours are within normal limits. Both lungs are clear. The visualized skeletal structures are unremarkable. IMPRESSION: No active cardiopulmonary disease. Electronically Signed   By: Dorise Bullion III M.D   On: 07/14/2018 21:12   Ct Head Wo Contrast  Result Date: 07/14/2018 CLINICAL DATA:  Left-sided numbness. EXAM: CT HEAD WITHOUT CONTRAST TECHNIQUE: Contiguous axial images were obtained from the base of the skull through the vertex without intravenous contrast. COMPARISON:  None. FINDINGS: Brain: Ventricles and sulci are appropriate for patient's age. No evidence for acute cortically based infarct, intracranial hemorrhage, mass lesion or mass-effect. Vascular: Unremarkable Skull: Intact Sinuses/Orbits: Paranasal sinuses are well aerated. Mastoid air cells are unremarkable. Orbits are unremarkable. Other: None. IMPRESSION: No acute intracranial process. Electronically Signed   By: Polly Cobia.D.  On: 07/14/2018 14:10   Mr Laqueta Jean And Wo Contrast  Addendum Date: 07/14/2018   ADDENDUM REPORT: 07/14/2018 22:44 ADDENDUM: Study discussed by telephone with PA Lyndel Safe on 07/14/2018 at 22:43. We agreed on Neurosurgery consultation. Electronically Signed   By: Odessa Fleming M.D.   On: 07/14/2018 22:44   Result Date: 07/14/2018 CLINICAL DATA:  38 year old male who awoke yesterday with left side weakness and numbness. EXAM: MRI HEAD WITHOUT AND WITH CONTRAST MRI CERVICAL SPINE WITHOUT AND WITH CONTRAST TECHNIQUE: Multiplanar, multiecho pulse sequences of the brain and surrounding structures, and cervical spine, to include the craniocervical junction and cervicothoracic junction, were obtained without and with intravenous contrast. CONTRAST:  8 milliliters Gadavist COMPARISON:  Head CT without contrast earlier today. FINDINGS: MRI  HEAD FINDINGS Brain: Cerebral volume is within normal limits. No restricted diffusion to suggest acute infarction. No midline shift, mass effect, evidence of mass lesion, ventriculomegaly, extra-axial collection or acute intracranial hemorrhage. Cervicomedullary junction and pituitary are within normal limits. Wallace Cullens and white matter signal is within normal limits throughout the brain. No encephalomalacia or chronic blood products. No abnormal enhancement identified.  No dural thickening. Vascular: Major intracranial vascular flow voids are preserved. The major dural venous sinuses are enhancing and appear to be patent. Skull and upper cervical spine: Visualized bone marrow signal is within normal limits. Cervical spine is described below. Sinuses/Orbits: Negative orbits. Trace paranasal sinus mucosal thickening. Other: Mastoids are clear. Visible internal auditory structures appear normal. Scalp and face soft tissues appear negative. MRI CERVICAL SPINE FINDINGS Alignment: Straightening and mild reversal of cervical lordosis. No spondylolisthesis. Vertebrae: No marrow edema or evidence of acute osseous abnormality. Visualized bone marrow signal is within normal limits. Cord: Abnormal spinal cord signal at C5-C6 with increased T2 and STIR signal (series 26, image 8). 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 There is also faint post contrast enhancement of the cord just below the maximal level of compression, which is a feature described with compressive myelopathy. No other abnormal intradural enhancement. No dural thickening. Visible spinal cord signal elsewhere is within normal limits. Posterior Fossa, vertebral arteries, paraspinal tissues: Cervicomedullary junction is within normal limits. Brain parenchyma described above. Preserved major vascular flow voids in the neck. Negative neck soft tissues. Negative visible right lung apex. Disc levels: C2-C3:  Negative.  C3-C4: Borderline to mild disc bulging with superimposed small central to slightly right paracentral disc protrusion (series 27, image 16 and series 28, image 16. Mild spinal stenosis and ventral cord mass effect. No cord signal abnormality. Borderline to mild right greater than left C4 foraminal stenosis. C4-C5: Mild disc bulging. Mild spinal stenosis which appears in large part congenital due to short pedicle distance. Mild if any spinal cord mass effect and no cord signal abnormality. C5-C6: Bulky left paracentral disc extrusion (series 25, image 8 and series 27, image 27). With moderate spinal stenosis and spinal cord mass effect. Abnormal signal is detailed above. Superimposed disc bulging and endplate spurring. Mild to moderate left and mild right C6 foraminal stenosis. C6-C7:  Minimal disc bulge.  No significant stenosis. C7-T1:  Negative. Negative visible upper thoracic levels. IMPRESSION: 1.  Normal MRI appearance of the brain. 2. Abnormal cervical spine: 3. Bulky disc herniation at C5-C6 eccentric to the left with moderate spinal stenosis, moderate cord mass effect, and abnormal cord signal compatible with edema versus developing myelomalacia secondary to compressive myelopathy. 4. Smaller disc herniation at C3-C4 with up to mild spinal stenosis and cord  mass effect. 5. 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 Electronically Signed: By: Odessa Fleming M.D. On: 07/14/2018 22:36   Mr Cervical Spine W Or Wo Contrast  Addendum Date: 07/14/2018   ADDENDUM REPORT: 07/14/2018 22:44 ADDENDUM: Study discussed by telephone with PA Lyndel Safe on 07/14/2018 at 22:43. We agreed on Neurosurgery consultation. Electronically Signed   By: Odessa Fleming M.D.   On: 07/14/2018 22:44   Result Date: 07/14/2018 CLINICAL DATA:  38 year old male who awoke yesterday with left side weakness and numbness. EXAM: MRI HEAD WITHOUT AND WITH CONTRAST MRI CERVICAL SPINE WITHOUT AND WITH CONTRAST  TECHNIQUE: Multiplanar, multiecho pulse sequences of the brain and surrounding structures, and cervical spine, to include the craniocervical junction and cervicothoracic junction, were obtained without and with intravenous contrast. CONTRAST:  8 milliliters Gadavist COMPARISON:  Head CT without contrast earlier today. FINDINGS: MRI HEAD FINDINGS Brain: Cerebral volume is within normal limits. No restricted diffusion to suggest acute infarction. No midline shift, mass effect, evidence of mass lesion, ventriculomegaly, extra-axial collection or acute intracranial hemorrhage. Cervicomedullary junction and pituitary are within normal limits. Wallace Cullens and white matter signal is within normal limits throughout the brain. No encephalomalacia or chronic blood products. No abnormal enhancement identified.  No dural thickening. Vascular: Major intracranial vascular flow voids are preserved. The major dural venous sinuses are enhancing and appear to be patent. Skull and upper cervical spine: Visualized bone marrow signal is within normal limits. Cervical spine is described below. Sinuses/Orbits: Negative orbits. Trace paranasal sinus mucosal thickening. Other: Mastoids are clear. Visible internal auditory structures appear normal. Scalp and face soft tissues appear negative. MRI CERVICAL SPINE FINDINGS Alignment: Straightening and mild reversal of cervical lordosis. No spondylolisthesis. Vertebrae: No marrow edema or evidence of acute osseous abnormality. Visualized bone marrow signal is within normal limits. Cord: Abnormal spinal cord signal at C5-C6 with increased T2 and STIR signal (series 26, image 8). 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 There is also faint post contrast enhancement of the cord just below the maximal level of compression, which is a feature described with compressive myelopathy. No other abnormal intradural enhancement. No dural thickening. Visible spinal  cord signal elsewhere is within normal limits. Posterior Fossa, vertebral arteries, paraspinal tissues: Cervicomedullary junction is within normal limits. Brain parenchyma described above. Preserved major vascular flow voids in the neck. Negative neck soft tissues. Negative visible right lung apex. Disc levels: C2-C3:  Negative. C3-C4: Borderline to mild disc bulging with superimposed small central to slightly right paracentral disc protrusion (series 27, image 16 and series 28, image 16. Mild spinal stenosis and ventral cord mass effect. No cord signal abnormality. Borderline to mild right greater than left C4 foraminal stenosis. C4-C5: Mild disc bulging. Mild spinal stenosis which appears in large part congenital due to short pedicle distance. Mild if any spinal cord mass effect and no cord signal abnormality. C5-C6: Bulky left paracentral disc extrusion (series 25, image 8 and series 27, image 27). With moderate spinal stenosis and spinal cord mass effect. Abnormal signal is detailed above. Superimposed disc bulging and endplate spurring. Mild to moderate left and mild right C6 foraminal stenosis. C6-C7:  Minimal disc bulge.  No significant stenosis. C7-T1:  Negative. Negative visible upper thoracic levels. IMPRESSION: 1.  Normal MRI appearance of the brain. 2. Abnormal cervical spine: 3. Bulky disc herniation at C5-C6 eccentric to the left with moderate spinal stenosis, moderate cord mass effect, and abnormal cord signal  compatible with edema versus developing myelomalacia secondary to compressive myelopathy. 4. Smaller disc herniation at C3-C4 with up to mild spinal stenosis and cord mass effect. 5. 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 Electronically Signed: By: Odessa FlemingH  Hall M.D. On: 07/14/2018 22:36    EKG: Independently reviewed. Sinus rhythm, RAD, non-specific ST-T abnormalities.   Assessment/Plan   1. 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 Tobacco and alcohol abuse  - Patient has been thinking about quitting both of these, encouraged to do so, does not feel like he will need help  - He denies hx of EtOH withdrawal but drinks daily, will monitor with CIWA and use Ativan as-needed, supplement vitamins    PPE: Mask, face shield  DVT prophylaxis: SCD's  Code Status: Full  Family Communication: Discussed with patient  Consults called: Neurosurgery  Admission status: Inpatient     Briscoe Deutscherimothy S Mason Burleigh, MD [facility] Pager [phone]  If 7PM-7AM, please contact night-coverage [email or web address] Password [email or web address]  07/15/2018, 12:06 AM

## 2018-07-15 NOTE — Op Note (Signed)
PATIENT: Bryan Matthews  PROCEDURE DATE: 07/15/18  PRE-OPERATIVE DIAGNOSIS:  Cervical spinal cord injury, herniated nucleus pulposus   POST-OPERATIVE DIAGNOSIS:  Same   PROCEDURE:  C5-C6 Anterior Cervical Discectomy and Instrumented Fusion   SURGEON:  Surgeon(s) and Role:    Judith Part, MD - Primary   ANESTHESIA: ETGA   BRIEF HISTORY: This is a 38 year old man who presented with left sided numbness and weakness. The patient was found to have a large disc herniation at C5-6 with cord signal change. This was discussed with the patient as well as risks, benefits, and alternatives and the patient wished to proceed with surgical treatment.   OPERATIVE DETAIL: The patient was taken to the operating room and placed on the OR table in the supine position. A formal time out was performed with two patient identifiers and confirmed the operative site. Anesthesia was induced by the anesthesia team.  Fluoroscopy was used to localize the surgical level and an incision was marked in a skin crease. The area was then prepped and draped in a sterile fashion. A transverse linear incision was made on the right side of the neck. The platysma was divided and the sternocleidomastoid muscle was identified. The carotid sheath was palpated, identified, and retracted laterally with the sternocleidomastoid muscle. The strap muscles were identified and retracted medially and the pretracheal fascia was entered. A bent spinal needle was used with fluoroscopy to localize the surgical level after dissection. The longus colli were elevated bilaterally and a self-retaining retractor was placed. The endotracheal tube cuff balloon was deflated and reinflated after retractor placement.   Anterior osteophytes were removed until flush with the anterior vertebral body. The disc annulus was incised and a complete C5-C6 discectomy was performed. The posterior longitudinal ligament was incised followed by ligamentous and bony  removal until no central canal stenosis was present. Decompression was then taken out laterally into the bilateral foramina until no foraminal stenosis was palpable. A 63mm cortical allograft ([employer]tr[employer]) was inserted into the disc space as an interbody graft. An anterior plate ([employer]tr[employer]) was positioned and 4, 41mm screws were used to secure the plate to the C5 and C6 vertebral bodies. Hemostasis was obtained and the incision was closed in layers. All instrument and sponge counts were correct. The patient was then returned to anesthesia for emergence. No apparent complications at the completion of the procedure.   EBL:  51mL   DRAINS: none   SPECIMENS: none   Judith Part, MD 07/15/18 12:03 PM

## 2018-07-15 NOTE — ED Notes (Signed)
Report given to 3W RN. All questions answered.  

## 2018-07-15 NOTE — Brief Op Note (Signed)
07/15/2018  12:02 PM  PATIENT:  Bryan Matthews  38 y.o. 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, central cord syndrome, spinal cord injury  POST-OPERATIVE DIAGNOSIS:  Same  PROCEDURE:  Procedure(s): CERVICAL FIVE-SIX ANTERIOR CERVICAL DECOMPRESSION/DISCECTOMY FUSION (N/A)  SURGEON:  Surgeon(s) and Role:    * Ludie Pavlik, Joyice Faster, MD - Primary  PHYSICIAN ASSISTANT:   ASSISTANTS: none   ANESTHESIA:   general  EBL:  100 mL   BLOOD ADMINISTERED:none  DRAINS: none   LOCAL MEDICATIONS USED:  LIDOCAINE   SPECIMEN:  No Specimen  DISPOSITION OF SPECIMEN:  N/A  COUNTS:  YES  TOURNIQUET:  * No tourniquets in log *  DICTATION: .Note written in EPIC  PLAN OF CARE: Admit to inpatient   PATIENT DISPOSITION:  PACU - hemodynamically stable.   Delay start of Pharmacological VTE agent (>24hrs) due to surgical blood loss or risk of bleeding: yes

## 2018-07-16 ENCOUNTER — Encounter (HOSPITAL_COMMUNITY): Payer: Self-pay | Admitting: Neurological Surgery

## 2018-07-16 LAB — RAPID URINE DRUG SCREEN, HOSP PERFORMED
Amphetamines: NOT DETECTED
Barbiturates: NOT DETECTED
Benzodiazepines: POSITIVE — AB
Cocaine: POSITIVE — AB
Opiates: POSITIVE — AB
Tetrahydrocannabinol: POSITIVE — AB

## 2018-07-16 NOTE — Progress Notes (Signed)
History and Physical    Bryan Matthews ZOX:[id number] DOB: 1981/01/09 DOA: 07/14/2018  PCP: Wallis BambergMani, Mario, PA-C   Patient coming from: Home   Chief Complaint: Left-sided weakness and numbness   HPI: Bryan Matthews is a 38 y.o. male with medical history significant for childhood asthma, tobacco abuse, and excessive daily alcohol use, now presenting to the emergency department for evaluation of left-sided numbness and weakness.  Patient reports a long history of chronic neck pain that has not limited his activity, but several months ago, he developed numbness and weakness involving the left arm and leg that prompted him to seek evaluation at an outpatient clinic where he had imaging and was told that he had arthritis and a pinched nerve.  Symptoms had improved and he was able to go back to his usual activities, but continued to have some numbness and weakness.  When he woke the morning of 07/13/2018, he noted a marked increase in numbness and weakness involving both upper and lower extremities on the left.  He reports falling a few times due to this and has been unable to use his left hand as usual.  He denies any recent injury or trauma.  He denies any fevers, chills, cough, shortness of breath, or sick contacts.  He reports smoking 1/2 pack/day but often thinks about quitting.  He reports drinking 2-6 alcoholic beverages every day, occasionally more, denies history of withdrawal but drinks every day. He has not needed an inhaler in 15 or 20 years. He apologizes about being rude with ED personnel earlier, explains that he was very frustrated due to his newly disabling neurologic deficits.   ED Course: Upon arrival to the ED, patient is found to be afebrile, saturating well on room air, and with stable blood pressure.  EKG features a sinus rhythm with right axis deviation and nonspecific ST-T abnormality.  Chest x-ray is negative for acute cardiopulmonary disease and noncontrast head CT is negative for acute  intracranial underbelly.  Chemistry panel is unremarkable and CBC notable for mild leukocytosis.  COVID-19 testing is negative.  Neurology was consulted by the ED physician and recommended MRI brain and cervical spine with and without contrast which is notable for normal brain but bulky disc herniation at C5-6 with cord mass-effect, and smaller disc herniations at C3-4 and mild spinal stenosis at C4-5.  Neurosurgery was consulted by the ED physician who anticipates surgical management of this and recommends a medical admission to the hospital.  Review of Systems:  All other systems reviewed and apart from HPI, are negative.  Past Medical History:  Diagnosis Date   Asthma     Past Surgical History:  Procedure Laterality Date   ANTERIOR CERVICAL DECOMP/DISCECTOMY FUSION N/A 07/15/2018   Procedure: CERVICAL FIVE-SIX ANTERIOR CERVICAL DECOMPRESSION/DISCECTOMY FUSION;  Surgeon: Jadene Pierinistergard, Thomas A, MD;  Location: [facility] OR;  Service: Neurosurgery;  Laterality: N/A;     reports that he has been smoking. He has been smoking about 1.00 pack per day. He has never used smokeless tobacco. He reports current alcohol use of about 12.0 standard drinks of alcohol per week. He reports current drug use. Frequency: 3.00 times per week. Drug: Marijuana.  No Known Allergies  History reviewed. No pertinent family history.   Prior to Admission medications   Medication Sig Start Date End Date Taking? Authorizing Provider  cyclobenzaprine (FLEXERIL) 5 MG tablet Take 5 mg by mouth 3 (three) times daily as needed for muscle spasms.  05/18/18  Yes [provider]  ibuprofen (ADVIL) 200 MG tablet Take 400 mg by mouth every 6 (six) hours as needed (for pain).   Yes [provider]  acetaminophen-codeine (TYLENOL #3) 300-30 MG tablet Take 1-2 tablets by mouth every 8 (eight) hours as needed for moderate pain. Patient not taking: Reported on 07/22/2017 03/14/16   Dorena BodoKennard, Lawrence, NP    Physical  Exam: Vitals:   07/16/18 0023 07/16/18 0359 07/16/18 0846 07/16/18 1157  BP: (!) 144/80 133/87 (!) 143/93 132/77  Pulse: 65 63 66 63  Resp: 17 20 20 20   Temp: 97.7 F (36.5 C) 98.1 F (36.7 C) 97.6 F (36.4 C) 97.6 F (36.4 C)  TempSrc: Oral Oral Oral Oral  SpO2: 100% 100% 100% 100%    Constitutional: NAD, calm  Eyes: PERTLA, lids and conjunctivae normal ENMT: Mucous membranes are moist. Posterior pharynx clear of any exudate or lesions.   Neck: normal, supple, no masses, no thyromegaly Respiratory: clear to auscultation bilaterally, no wheezing, no crackles. No accessory muscle use.  Cardiovascular: S1 & S2 heard, regular rate and rhythm, no significant murmurs / rubs / gallops. No extremity edema.   Abdomen: No distension, no tenderness, soft. Bowel sounds active.  Musculoskeletal: no clubbing / cyanosis. No joint deformity upper and lower extremities.    Skin: no significant rashes, lesions, ulcers. Warm, dry, well-perfused. Neurologic: CN 2-12 grossly intact. Sensation to light touch diminished throughout LUE and LLE. Strength 3/5 in distal LUE, otherwise 5/5.  Psychiatric: Alert and oriented x 3. Calm, cooperative.     Labs on Admission: I have personally reviewed following labs and imaging studies  CBC: Recent Labs  Lab 07/14/18 1420 07/15/18 0732  WBC 11.1* 9.3  NEUTROABS 7.9*  --   HGB 15.9 15.4  HCT 47.8 45.5  MCV 94.5 92.3  PLT 248 242   Basic Metabolic Panel: Recent Labs  Lab 07/14/18 1420 07/15/18 0732  NA 138 139  K 3.5 3.3*  CL 104 102  CO2 27 27  GLUCOSE 90 94  BUN 10 8  CREATININE 0.90 1.00  CALCIUM 9.0 8.9   GFR: CrCl cannot be calculated (Unknown ideal weight.). Liver Function Tests: Recent Labs  Lab 07/14/18 1420  AST 18  ALT 15  ALKPHOS 88  BILITOT 0.9  PROT 7.2  ALBUMIN 4.0   No results for input(s): LIPASE, AMYLASE in the last 168 hours. No results for input(s): AMMONIA in the last 168 hours. Coagulation Profile: No results  for input(s): INR, PROTIME in the last 168 hours. Cardiac Enzymes: No results for input(s): CKTOTAL, CKMB, CKMBINDEX, TROPONINI in the last 168 hours. BNP (last 3 results) No results for input(s): PROBNP in the last 8760 hours. HbA1C: No results for input(s): HGBA1C in the last 72 hours. CBG: No results for input(s): GLUCAP in the last 168 hours. Lipid Profile: No results for input(s): CHOL, HDL, LDLCALC, TRIG, CHOLHDL, LDLDIRECT in the last 72 hours. Thyroid Function Tests: No results for input(s): TSH, T4TOTAL, FREET4, T3FREE, THYROIDAB in the last 72 hours. Anemia Panel: No results for input(s): VITAMINB12, FOLATE, FERRITIN, TIBC, IRON, RETICCTPCT in the last 72 hours. Urine analysis:    Component Value Date/Time   COLORURINE YELLOW 12/02/2010 1200   APPEARANCEUR CLEAR 12/02/2010 1200   LABSPEC 1.019 12/02/2010 1200   PHURINE 6.0 12/02/2010 1200   GLUCOSEU NEGATIVE 12/02/2010 1200   HGBUR NEGATIVE 12/02/2010 1200   BILIRUBINUR NEGATIVE 12/02/2010 1200   KETONESUR NEGATIVE 12/02/2010 1200   PROTEINUR NEGATIVE 12/02/2010 1200   UROBILINOGEN 0.2 12/02/2010 1200  NITRITE NEGATIVE 12/02/2010 1200   LEUKOCYTESUR NEGATIVE 12/02/2010 1200   Sepsis Labs: @LABRCNTIP (procalcitonin:4,lacticidven:4) ) Recent Results (from the past 240 hour(s))  SARS Coronavirus 2 (CEPHEID - Performed in [facility] lab), Hosp Order     Status: None   Collection Time: 07/14/18  5:59 PM   Specimen: Nasopharyngeal Swab  Result Value Ref Range Status   SARS Coronavirus 2 NEGATIVE NEGATIVE Final    Comment: (NOTE) If result is NEGATIVE SARS-CoV-2 target nucleic acids are NOT DETECTED. The SARS-CoV-2 RNA is generally detectable in upper and lower  respiratory specimens during the acute phase of infection. The lowest  concentration of SARS-CoV-2 viral copies this assay can detect is 250  copies / mL. A negative result does not preclude SARS-CoV-2 infection  and should not be used as the sole  basis for treatment or other  patient management decisions.  A negative result may occur with  improper specimen collection / handling, submission of specimen other  than nasopharyngeal swab, presence of viral mutation(s) within the  areas targeted by this assay, and inadequate number of viral copies  (<250 copies / mL). A negative result must be combined with clinical  observations, patient history, and epidemiological information. If result is POSITIVE SARS-CoV-2 target nucleic acids are DETECTED. The SARS-CoV-2 RNA is generally detectable in upper and lower  respiratory specimens dur ing the acute phase of infection.  Positive  results are indicative of active infection with SARS-CoV-2.  Clinical  correlation with patient history and other diagnostic information is  necessary to determine patient infection status.  Positive results do  not rule out bacterial infection or co-infection with other viruses. If result is PRESUMPTIVE POSTIVE SARS-CoV-2 nucleic acids MAY BE PRESENT.   A presumptive positive result was obtained on the submitted specimen  and confirmed on repeat testing.  While 2019 novel coronavirus  (SARS-CoV-2) nucleic acids may be present in the submitted sample  additional confirmatory testing may be necessary for epidemiological  and / or clinical management purposes  to differentiate between  SARS-CoV-2 and other Sarbecovirus currently known to infect humans.  If clinically indicated additional testing with an alternate test  methodology [phone]([id number]) is advised. The SARS-CoV-2 RNA is generally  detectable in upper and lower respiratory sp ecimens during the acute  phase of infection. The expected result is Negative. Fact Sheet for Patients:  BoilerBrush.com.cyhttps://www.fda.gov/media/136312/download Fact Sheet for Healthcare Providers: https://pope.com/https://www.fda.gov/media/136313/download This test is not yet approved or cleared by the [place] FDA and has been authorized for detection  and/or diagnosis of SARS-CoV-2 by FDA under an Emergency Use Authorization (EUA).  This EUA will remain in effect (meaning this test can be used) for the duration of the COVID-19 declaration under Section 564(b)(1) of the Act, 21 U.S.C. section 360bbb-3(b)(1), unless the authorization is terminated or revoked sooner. Performed at [facility], [address] [place], [place] [address]      Radiological Exams on Admission: Dg Chest 2 View  Result Date: 07/14/2018 CLINICAL DATA:  Left-sided numbness. EXAM: CHEST - 2 VIEW COMPARISON:  December 03, 2015 FINDINGS: The heart size and mediastinal contours are within normal limits. Both lungs are clear. The visualized skeletal structures are unremarkable. IMPRESSION: No active cardiopulmonary disease. Electronically Signed   By: Gerome Samavid  Williams III M.D   On: 07/14/2018 21:12   Dg Cervical Spine 1 View  Result Date: 07/15/2018 CLINICAL DATA:  38 year old male undergoing cervical C5-C6 anterior cervical decompression/discectomy and fusion EXAM: DG CERVICAL SPINE - 1 VIEW COMPARISON:  MRI 07/14/2018 FINDINGS: Intraoperative cross-table lateral radiographs demonstrate interval C5-C6 anterior cervical discectomy and fusion. No evidence of immediate hardware complication. IMPRESSION: C5-C6 ACDF in progress. Electronically Signed   By: Malachy Moan M.D.   On: 07/15/2018 12:04   Mr Laqueta Jean And Wo Contrast  Addendum Date: 07/14/2018   ADDENDUM REPORT: 07/14/2018 22:44 ADDENDUM: Study discussed by telephone with PA Lyndel Safe on 07/14/2018 at 22:43. We agreed on Neurosurgery consultation. Electronically Signed   By: Odessa Fleming M.D.   On: 07/14/2018 22:44   Result Date: 07/14/2018 CLINICAL DATA:  38 year old male who awoke yesterday with left side weakness and numbness. EXAM: MRI HEAD WITHOUT AND WITH CONTRAST MRI CERVICAL SPINE WITHOUT AND WITH CONTRAST TECHNIQUE: Multiplanar, multiecho pulse sequences of the brain and surrounding structures, and  cervical spine, to include the craniocervical junction and cervicothoracic junction, were obtained without and with intravenous contrast. CONTRAST:  8 milliliters Gadavist COMPARISON:  Head CT without contrast earlier today. FINDINGS: MRI HEAD FINDINGS Brain: Cerebral volume is within normal limits. No restricted diffusion to suggest acute infarction. No midline shift, mass effect, evidence of mass lesion, ventriculomegaly, extra-axial collection or acute intracranial hemorrhage. Cervicomedullary junction and pituitary are within normal limits. Wallace Cullens and white matter signal is within normal limits throughout the brain. No encephalomalacia or chronic blood products. No abnormal enhancement identified.  No dural thickening. Vascular: Major intracranial vascular flow voids are preserved. The major dural venous sinuses are enhancing and appear to be patent. Skull and upper cervical spine: Visualized bone marrow signal is within normal limits. Cervical spine is described below. Sinuses/Orbits: Negative orbits. Trace paranasal sinus mucosal thickening. Other: Mastoids are clear. Visible internal auditory structures appear normal. Scalp and face soft tissues appear negative. MRI CERVICAL SPINE FINDINGS Alignment: Straightening and mild reversal of cervical lordosis. No spondylolisthesis. Vertebrae: No marrow edema or evidence of acute osseous abnormality. Visualized bone marrow signal is within normal limits. Cord: Abnormal spinal cord signal at C5-C6 with increased T2 and STIR signal (series 26, image 8). 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 There is also faint post contrast enhancement of the cord just below the maximal level of compression, which is a feature described with compressive myelopathy. No other abnormal intradural enhancement. No dural thickening. Visible spinal cord signal elsewhere is within normal limits. Posterior Fossa, vertebral arteries, paraspinal  tissues: Cervicomedullary junction is within normal limits. Brain parenchyma described above. Preserved major vascular flow voids in the neck. Negative neck soft tissues. Negative visible right lung apex. Disc levels: C2-C3:  Negative. C3-C4: Borderline to mild disc bulging with superimposed small central to slightly right paracentral disc protrusion (series 27, image 16 and series 28, image 16. Mild spinal stenosis and ventral cord mass effect. No cord signal abnormality. Borderline to mild right greater than left C4 foraminal stenosis. C4-C5: Mild disc bulging. Mild spinal stenosis which appears in large part congenital due to short pedicle distance. Mild if any spinal cord mass effect and no cord signal abnormality. C5-C6: Bulky left paracentral disc extrusion (series 25, image 8 and series 27, image 27). With moderate spinal stenosis and spinal cord mass effect. Abnormal signal is detailed above. Superimposed disc bulging and endplate spurring. Mild to moderate left and mild right C6 foraminal stenosis. C6-C7:  Minimal disc bulge.  No significant stenosis. C7-T1:  Negative. Negative visible upper thoracic levels. IMPRESSION: 1.  Normal MRI appearance of the brain. 2. Abnormal cervical spine: 3. Bulky disc herniation at C5-C6 eccentric to the  left with moderate spinal stenosis, moderate cord mass effect, and abnormal cord signal compatible with edema versus developing myelomalacia secondary to compressive myelopathy. 4. Smaller disc herniation at C3-C4 with up to mild spinal stenosis and cord mass effect. 5. 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 Electronically Signed: By: Odessa Fleming M.D. On: 07/14/2018 22:36   Mr Cervical Spine W Or Wo Contrast  Addendum Date: 07/14/2018   ADDENDUM REPORT: 07/14/2018 22:44 ADDENDUM: Study discussed by telephone with PA Lyndel Safe on 07/14/2018 at 22:43. We agreed on Neurosurgery consultation. Electronically Signed   By: Odessa Fleming  M.D.   On: 07/14/2018 22:44   Result Date: 07/14/2018 CLINICAL DATA:  38 year old male who awoke yesterday with left side weakness and numbness. EXAM: MRI HEAD WITHOUT AND WITH CONTRAST MRI CERVICAL SPINE WITHOUT AND WITH CONTRAST TECHNIQUE: Multiplanar, multiecho pulse sequences of the brain and surrounding structures, and cervical spine, to include the craniocervical junction and cervicothoracic junction, were obtained without and with intravenous contrast. CONTRAST:  8 milliliters Gadavist COMPARISON:  Head CT without contrast earlier today. FINDINGS: MRI HEAD FINDINGS Brain: Cerebral volume is within normal limits. No restricted diffusion to suggest acute infarction. No midline shift, mass effect, evidence of mass lesion, ventriculomegaly, extra-axial collection or acute intracranial hemorrhage. Cervicomedullary junction and pituitary are within normal limits. Wallace Cullens and white matter signal is within normal limits throughout the brain. No encephalomalacia or chronic blood products. No abnormal enhancement identified.  No dural thickening. Vascular: Major intracranial vascular flow voids are preserved. The major dural venous sinuses are enhancing and appear to be patent. Skull and upper cervical spine: Visualized bone marrow signal is within normal limits. Cervical spine is described below. Sinuses/Orbits: Negative orbits. Trace paranasal sinus mucosal thickening. Other: Mastoids are clear. Visible internal auditory structures appear normal. Scalp and face soft tissues appear negative. MRI CERVICAL SPINE FINDINGS Alignment: Straightening and mild reversal of cervical lordosis. No spondylolisthesis. Vertebrae: No marrow edema or evidence of acute osseous abnormality. Visualized bone marrow signal is within normal limits. Cord: Abnormal spinal cord signal at C5-C6 with increased T2 and STIR signal (series 26, image 8). 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 There is  also faint post contrast enhancement of the cord just below the maximal level of compression, which is a feature described with compressive myelopathy. No other abnormal intradural enhancement. No dural thickening. Visible spinal cord signal elsewhere is within normal limits. Posterior Fossa, vertebral arteries, paraspinal tissues: Cervicomedullary junction is within normal limits. Brain parenchyma described above. Preserved major vascular flow voids in the neck. Negative neck soft tissues. Negative visible right lung apex. Disc levels: C2-C3:  Negative. C3-C4: Borderline to mild disc bulging with superimposed small central to slightly right paracentral disc protrusion (series 27, image 16 and series 28, image 16. Mild spinal stenosis and ventral cord mass effect. No cord signal abnormality. Borderline to mild right greater than left C4 foraminal stenosis. C4-C5: Mild disc bulging. Mild spinal stenosis which appears in large part congenital due to short pedicle distance. Mild if any spinal cord mass effect and no cord signal abnormality. C5-C6: Bulky left paracentral disc extrusion (series 25, image 8 and series 27, image 27). With moderate spinal stenosis and spinal cord mass effect. Abnormal signal is detailed above. Superimposed disc bulging and endplate spurring. Mild to moderate left and mild right C6 foraminal stenosis. C6-C7:  Minimal disc bulge.  No significant stenosis. C7-T1:  Negative. Negative visible upper  thoracic levels. IMPRESSION: 1.  Normal MRI appearance of the brain. 2. Abnormal cervical spine: 3. Bulky disc herniation at C5-C6 eccentric to the left with moderate spinal stenosis, moderate cord mass effect, and abnormal cord signal compatible with edema versus developing myelomalacia secondary to compressive myelopathy. 4. Smaller disc herniation at C3-C4 with up to mild spinal stenosis and cord mass effect. 5. 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  Electronically Signed: By: Genevie Ann M.D. On: 07/14/2018 22:36   Dg C-arm 1-60 Min  Result Date: 07/15/2018 CLINICAL DATA:  C5-6 cervical decompression. FLUOROSCOPY TIME:  18 seconds. EXAM: DG C-ARM 61-120 MIN COMPARISON:  None. FINDINGS: Anterior plate and screws have been affixed to the cervical spine at C5 and C6. Hardware is in good position. It appears bone plug material is been placed but it is not well assessed. IMPRESSION: 1. Anterior plate and screws have been placed at C5 and C6, in good position. 2. It appears bone plug material may have been placed at the C5-6 level but the exact location of this material is not well assessed on provided images. Electronically Signed   By: Dorise Bullion III M.D   On: 07/15/2018 12:05    EKG: Independently reviewed. Sinus rhythm, RAD, non-specific ST-T abnormalities.   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 Tobacco and alcohol abuse  - Patient has been thinking about quitting both of these, encouraged to do so, does not feel like he will need help  - He denies hx of EtOH withdrawal but drinks daily, will monitor with CIWA and use Ativan as-needed, supplement vitamins    DVT prophylaxis: SCD's  Code Status: Full  Family Communication: Discussed with patient  Consults called: Neurosurgery   Georgette Shell, MD [facility] Pager [phone]  If 7PM-7AM, please contact night-coverage [email or web address] Password [email or web address]  07/16/2018, 4:05 PM

## 2018-07-16 NOTE — Progress Notes (Signed)
Neurosurgery Service Progress Note  Subjective: No acute events overnight, arm strength and numbness improving, leg still with some increased tone but ambulating with a walker this morning   Objective: Vitals:   07/15/18 1639 07/15/18 1952 07/16/18 0023 07/16/18 0359  BP: (!) 157/92 (!) 145/94 (!) 144/80 133/87  Pulse: 61 69 65 63  Resp: 16 16 17 20   Temp: 97.8 F (36.6 C) (!) 97.5 F (36.4 C) 97.7 F (36.5 C) 98.1 F (36.7 C)  TempSrc: Oral Oral Oral Oral  SpO2: 100% 100% 100% 100%   Temp (24hrs), Avg:97.6 F (36.4 C), Min:97.2 F (36.2 C), Max:98.1 F (36.7 C)  CBC Latest Ref Rng & Units 07/15/2018 07/14/2018 03/24/2014  WBC 4.0 - 10.5 K/uL 9.3 11.1(H) 9.3  Hemoglobin 13.0 - 17.0 g/dL 29.515.4 62.115.9 30.816.6  Hematocrit 39.0 - 52.0 % 45.5 47.8 47.7  Platelets 150 - 400 K/uL 242 248 223   BMP Latest Ref Rng & Units 07/15/2018 07/14/2018 03/24/2014  Glucose 70 - 99 mg/dL 94 90 657(Q116(H)  BUN 6 - 20 mg/dL 8 10 11   Creatinine 0.61 - 1.24 mg/dL 4.691.00 6.290.90 5.281.03  Sodium 135 - 145 mmol/L 139 138 139  Potassium 3.5 - 5.1 mmol/L 3.3(L) 3.5 3.7  Chloride 98 - 111 mmol/L 102 104 105  CO2 22 - 32 mmol/L 27 27 25   Calcium 8.9 - 10.3 mg/dL 8.9 9.0 9.1    Intake/Output Summary (Last 24 hours) at 07/16/2018 0819 Last data filed at 07/16/2018 0749 Gross per 24 hour  Intake 1239 ml  Output 600 ml  Net 639 ml    Current Facility-Administered Medications:  .  acetaminophen (TYLENOL) tablet 650 mg, 650 mg, Oral, Q6H PRN **OR** acetaminophen (TYLENOL) suppository 650 mg, 650 mg, Rectal, Q6H PRN, Opyd, Timothy S, MD .  folic acid (FOLVITE) tablet 1 mg, 1 mg, Oral, Daily, Opyd, Lavone Neriimothy S, MD, Stopped at 07/15/18 1433 .  HYDROcodone-acetaminophen (NORCO/VICODIN) 5-325 MG per tablet 1-2 tablet, 1-2 tablet, Oral, Q4H PRN, Opyd, Lavone Neriimothy S, MD, 2 tablet at 07/16/18 0512 .  lactated ringers infusion, , Intravenous, Continuous, Arta Brucessey, Kevin, MD, Last Rate: 10 mL/hr at 07/15/18 0835 .  lip balm (CARMEX) ointment, ,  Topical, PRN, Alwyn RenMathews, Elizabeth G, MD .  LORazepam (ATIVAN) tablet 1 mg, 1 mg, Oral, Q6H PRN **OR** LORazepam (ATIVAN) injection 1 mg, 1 mg, Intravenous, Q6H PRN, Opyd, Lavone Neriimothy S, MD .  LORazepam (ATIVAN) tablet 0-4 mg, 0-4 mg, Oral, Q6H, 1 mg at 07/15/18 0144 **FOLLOWED BY** [START ON 07/17/2018] LORazepam (ATIVAN) tablet 0-4 mg, 0-4 mg, Oral, Q12H, Opyd, Timothy S, MD .  menthol-cetylpyridinium (CEPACOL) lozenge 3 mg, 1 lozenge, Oral, PRN, Alwyn RenMathews, Elizabeth G, MD, 3 mg at 07/15/18 1614 .  morphine 4 MG/ML injection 4 mg, 4 mg, Intravenous, Q4H PRN, Opyd, Timothy S, MD .  multivitamin with minerals tablet 1 tablet, 1 tablet, Oral, Daily, Opyd, Lavone Neriimothy S, MD, Stopped at 07/15/18 1433 .  nicotine (NICODERM CQ - dosed in mg/24 hours) patch 14 mg, 14 mg, Transdermal, Daily, Opyd, Lavone Neriimothy S, MD, 14 mg at 07/15/18 1430 .  ondansetron (ZOFRAN) tablet 4 mg, 4 mg, Oral, Q6H PRN **OR** ondansetron (ZOFRAN) injection 4 mg, 4 mg, Intravenous, Q6H PRN, Opyd, Timothy S, MD .  phenol (CHLORASEPTIC) mouth spray 1 spray, 1 spray, Mouth/Throat, PRN, Alwyn RenMathews, Elizabeth G, MD .  polyethylene glycol (MIRALAX / GLYCOLAX) packet 17 g, 17 g, Oral, Daily PRN, Opyd, Lavone Neriimothy S, MD .  thiamine (VITAMIN B-1) tablet 100 mg, 100 mg, Oral, Daily,  Stopped at 07/15/18 1434 **OR** thiamine (B-1) injection 100 mg, 100 mg, Intravenous, Daily, Opyd, Ilene Qua, MD .  zolpidem (AMBIEN) tablet 5 mg, 5 mg, Oral, QHS PRN,MR X 1, Opyd, Ilene Qua, MD, 5 mg at 07/16/18 0110   Physical Exam: AOx3, PERRL, EOMI, FS, Strength 5/5 except 4+/5 in LUE and LLE, SILTx4, LUE numbness improving, stable in LLE Incision c/d/i, neck soft  Assessment & Plan: 38 y.o. man with progressive LUE/LLE weakness, MRI C-spine with large disc herniation and cord signal change, 7/5 s/p single level ACDF, recovering well.  -activity as tolerated, no cervical collar needed, can wear soft collar for comfort -advance diet as tolerated, no restrictions -PT/OT  eval -hold SQH until 07/17/2018  Bryan Matthews  07/16/18 8:19 AM

## 2018-07-17 LAB — HIV ANTIBODY (ROUTINE TESTING W REFLEX): HIV Screen 4th Generation wRfx: NONREACTIVE

## 2018-07-17 MED ORDER — FOLIC ACID 1 MG PO TABS: 1.0000 mg | ORAL_TABLET | Freq: Every day | ORAL | Status: DC

## 2018-07-17 MED ORDER — HYDROCODONE-ACETAMINOPHEN 5-325 MG PO TABS: 1.0000 | ORAL_TABLET | ORAL | 0 refills | Status: DC | PRN

## 2018-07-17 MED ORDER — ACETAMINOPHEN 325 MG PO TABS: 650.0000 mg | ORAL_TABLET | Freq: Four times a day (QID) | ORAL | Status: DC | PRN

## 2018-07-17 NOTE — TOC Transition Note (Signed)
Transition of Care [facility]) - CM/SW Discharge Note   Patient Details  Name: BRAELEN SPROULE MRN: [id number] Date of Birth: [birth year]  Transition of Care [facility]) CM/SW Contact:  Pollie Friar, RN Phone Number: 07/17/2018, 1:08 PM   Clinical Narrative:    Pt discharging home with self care. No f/u per PT.  Walker delivered to the room.  Pt has transportation home.   Final next level of care: Home/Self Care Barriers to Discharge: No Barriers Identified   Patient Goals and CMS Choice     Choice offered to / list presented to : Patient  Discharge Placement                       Discharge Plan and Services                DME Arranged: Walker rolling DME Agency: [employer] Date DME Agency Contacted: 07/17/18   Representative spoke with at DME Agency: Zack            Social Determinants of Health ([place]) Interventions     Readmission Risk Interventions No flowsheet data found.

## 2018-07-17 NOTE — Evaluation (Signed)
Occupational Therapy Evaluation Patient Details Name: Bryan Matthews MRN: [id number] DOB: 1980/07/26 Today's Date: 07/17/2018    History of Present Illness 38 y.o. 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  Pt with significant PMH of asthma.     Clinical Impression   This 38 y/o male presents with the above. PTA pt reports he is independent with ADL and functional mobility. Pt performing functional mobility in room using RW with minguard-supervision. Completing toileting and standing grooming ADL with minguard/supervision overall, requires minguard assist for LB ADL. Pt requires min cues to maintain cervical precautions during functional tasks. Further reviewed precautions, safety and compensatory techniques for completing ADL and functional transfers with pt verbalizing understanding. Pt reports has good family support at home who can assist with ADL/iADL PRN. Questions answered throughout with no further acute OT needs identified at this time. Acute OT to sign off, thank you for this referral.     Follow Up Recommendations  No OT follow up;Supervision/Assistance - 24 hour(24hr initially)    Equipment Recommendations  None recommended by OT           Precautions / Restrictions Precautions Precautions: Fall;Cervical Precaution Booklet Issued: (verbally reviewed) Precaution Comments: left sided weakness and numbness Required Braces or Orthoses: Cervical Brace Cervical Brace: Soft collar;For comfort Restrictions Weight Bearing Restrictions: No      Mobility Bed Mobility Overal bed mobility: Modified Independent                Transfers Overall transfer level: Needs assistance Equipment used: Rolling walker (2 wheeled) Transfers: Sit to/from Stand Sit to Stand: Min guard         General transfer comment: for safety and balance    Balance Overall balance assessment: Mild deficits  observed, not formally tested                                         ADL either performed or assessed with clinical judgement   ADL Overall ADL's : Needs assistance/impaired Eating/Feeding: Modified independent;Sitting   Grooming: Wash/dry hands;Min guard;Standing   Upper Body Bathing: Set up;Sitting   Lower Body Bathing: Min guard;Sit to/from stand   Upper Body Dressing : Set up;Sitting   Lower Body Dressing: Min guard;Sit to/from stand Lower Body Dressing Details (indicate cue type and reason): pt able to utilize figure 4 technique to reach LEs Toilet Transfer: Min guard;Ambulation;RW   Toileting- Clothing Manipulation and Hygiene: Supervision/safety;Sit to/from stand Toileting - Clothing Manipulation Details (indicate cue type and reason): pt standing to void bladder in standing at toilet, performing clothing management including gown/shorts at supervision level   Tub/Shower Transfer Details (indicate cue type and reason): educated to use built in shower seat for increased safety and ease of task performance, pt verbalizing understanding Functional mobility during ADLs: Min guard;Rolling walker General ADL Comments: educated pt in safety and compensatory techniques for completing ADL and functional transfers during review of cervical precautions                         Pertinent Vitals/Pain Pain Assessment: Faces Faces Pain Scale: Hurts little more Pain Location: anterior neck/incisional Pain Descriptors / Indicators: Aching;Burning Pain Intervention(s): Limited activity within patient's tolerance;Monitored during session;Repositioned     Hand Dominance Right   Extremity/Trunk Assessment Upper Extremity Assessment  Upper Extremity Assessment: Overall WFL for tasks assessed   Lower Extremity Assessment Lower Extremity Assessment: Defer to PT evaluation LLE Deficits / Details: left leg with functional weakness, decreased foot clearance and  hyperextension moment at the knee in stance.  LLE Sensation: decreased light touch   Cervical / Trunk Assessment Cervical / Trunk Assessment: Other exceptions Cervical / Trunk Exceptions: post op cervical fusion   Communication Communication Communication: No difficulties   Cognition Arousal/Alertness: Awake/alert Behavior During Therapy: WFL for tasks assessed/performed Overall Cognitive Status: Within Functional Limits for tasks assessed                                     General Comments  Verbally reviewed cervical precautions, pt needed some functional reminders of posture, cues to stay inside of RW for posture, and to not lean forward to spit while brushing his teeth.     Exercises     Shoulder Instructions      Home Living Family/patient expects to be discharged to:: Private residence Living Arrangements: Spouse/significant other;Children Available Help at Discharge: Family Type of Home: House Home Access: Stairs to enter Secretary/administratorntrance Stairs-Number of Steps: 3 Entrance Stairs-Rails: Right Home Layout: One level     Bathroom Shower/Tub: Chief Strategy OfficerTub/shower unit   Bathroom Toilet: Standard     Home Equipment: Information systems managerhower seat - built in          Prior Functioning/Environment Level of Independence: Independent                 OT Problem List: Decreased range of motion;Decreased strength;Decreased activity tolerance;Decreased knowledge of precautions;Decreased knowledge of use of DME or AE      OT Treatment/Interventions:      OT Goals(Current goals can be found in the care plan section) Acute Rehab OT Goals Patient Stated Goal: home today OT Goal Formulation: All assessment and education complete, DC therapy  OT Frequency:     Barriers to D/C:            Co-evaluation              AM-PAC OT "6 Clicks" Daily Activity     Outcome Measure Help from another person eating meals?: None Help from another person taking care of personal  grooming?: None Help from another person toileting, which includes using toliet, bedpan, or urinal?: None Help from another person bathing (including washing, rinsing, drying)?: A Little Help from another person to put on and taking off regular upper body clothing?: None Help from another person to put on and taking off regular lower body clothing?: A Little 6 Click Score: 22   End of Session Equipment Utilized During Treatment: Rolling walker Nurse Communication: Mobility status  Activity Tolerance: Patient tolerated treatment well Patient left: in bed;with call bell/phone within reach  OT Visit Diagnosis: Other abnormalities of gait and mobility (R26.89);Muscle weakness (generalized) (M62.81)                Time: [id number] OT Time Calculation (min): 12 min Charges:  OT General Charges $OT Visit: 1 Visit OT Evaluation $OT Eval Moderate Complexity: 1 Mod  Marcy SirenBreanna Tiffeny Minchew, OT [facility] Pager [phone] Office [phone]   Orlando PennerBreanna L Demani Weyrauch 07/17/2018, 2:13 PM

## 2018-07-17 NOTE — Progress Notes (Signed)
Neurosurgery Service Progress Note  Subjective: No acute events overnight, arm strength and numbness improving  Objective: Vitals:   07/16/18 2122 07/16/18 2322 07/17/18 0350 07/17/18 0837  BP: (!) 144/90 (!) 146/100 (!) 140/94 129/78  Pulse: 71 68 72 67  Resp:  20 20 18   Temp:  98.3 F (36.8 C) 98.1 F (36.7 C) 97.9 F (36.6 C)  TempSrc:  Oral Oral Oral  SpO2:  100% 100% 99%  Weight: 80.7 kg     Height: 5\' 7"  (1.702 m)      Temp (24hrs), Avg:98 F (36.7 C), Min:97.6 F (36.4 C), Max:98.3 F (36.8 C)  CBC Latest Ref Rng & Units 07/15/2018 07/14/2018 03/24/2014  WBC 4.0 - 10.5 K/uL 9.3 11.1(H) 9.3  Hemoglobin 13.0 - 17.0 g/dL 15.4 15.9 16.6  Hematocrit 39.0 - 52.0 % 45.5 47.8 47.7  Platelets 150 - 400 K/uL 242 248 223   BMP Latest Ref Rng & Units 07/15/2018 07/14/2018 03/24/2014  Glucose 70 - 99 mg/dL 94 90 116(H)  BUN 6 - 20 mg/dL 8 10 11   Creatinine 0.61 - 1.24 mg/dL 1.00 0.90 1.03  Sodium 135 - 145 mmol/L 139 138 139  Potassium 3.5 - 5.1 mmol/L 3.3(L) 3.5 3.7  Chloride 98 - 111 mmol/L 102 104 105  CO2 22 - 32 mmol/L 27 27 25   Calcium 8.9 - 10.3 mg/dL 8.9 9.0 9.1    Intake/Output Summary (Last 24 hours) at 07/17/2018 1121 Last data filed at 07/16/2018 1200 Gross per 24 hour  Intake 320 ml  Output -  Net 320 ml    Current Facility-Administered Medications:  .  acetaminophen (TYLENOL) tablet 650 mg, 650 mg, Oral, Q6H PRN **OR** acetaminophen (TYLENOL) suppository 650 mg, 650 mg, Rectal, Q6H PRN, Opyd, Timothy S, MD .  folic acid (FOLVITE) tablet 1 mg, 1 mg, Oral, Daily, Opyd, Ilene Qua, MD, 1 mg at 07/17/18 0844 .  HYDROcodone-acetaminophen (NORCO/VICODIN) 5-325 MG per tablet 1-2 tablet, 1-2 tablet, Oral, Q4H PRN, Opyd, Ilene Qua, MD, 2 tablet at 07/17/18 0844 .  lactated ringers infusion, , Intravenous, Continuous, Lillia Abed, MD, Last Rate: 10 mL/hr at 07/15/18 0835 .  lip balm (CARMEX) ointment, , Topical, PRN, Georgette Shell, MD .  LORazepam (ATIVAN) tablet 1 mg,  1 mg, Oral, Q6H PRN **OR** LORazepam (ATIVAN) injection 1 mg, 1 mg, Intravenous, Q6H PRN, Opyd, Ilene Qua, MD .  [EXPIRED] LORazepam (ATIVAN) tablet 0-4 mg, 0-4 mg, Oral, Q6H, 1 mg at 07/15/18 0144 **FOLLOWED BY** LORazepam (ATIVAN) tablet 0-4 mg, 0-4 mg, Oral, Q12H, Opyd, Ilene Qua, MD .  menthol-cetylpyridinium (CEPACOL) lozenge 3 mg, 1 lozenge, Oral, PRN, Georgette Shell, MD, 3 mg at 07/15/18 1614 .  morphine 4 MG/ML injection 4 mg, 4 mg, Intravenous, Q4H PRN, Opyd, Timothy S, MD .  multivitamin with minerals tablet 1 tablet, 1 tablet, Oral, Daily, Opyd, Ilene Qua, MD, 1 tablet at 07/17/18 0844 .  ondansetron (ZOFRAN) tablet 4 mg, 4 mg, Oral, Q6H PRN **OR** ondansetron (ZOFRAN) injection 4 mg, 4 mg, Intravenous, Q6H PRN, Opyd, Timothy S, MD .  phenol (CHLORASEPTIC) mouth spray 1 spray, 1 spray, Mouth/Throat, PRN, Georgette Shell, MD .  polyethylene glycol (MIRALAX / GLYCOLAX) packet 17 g, 17 g, Oral, Daily PRN, Opyd, Ilene Qua, MD .  thiamine (VITAMIN B-1) tablet 100 mg, 100 mg, Oral, Daily, 100 mg at 07/17/18 0844 **OR** thiamine (B-1) injection 100 mg, 100 mg, Intravenous, Daily, Opyd, Timothy S, MD .  zolpidem (AMBIEN) tablet 5 mg, 5 mg, Oral,  QHS PRN,MR X 1, Opyd, Lavone Neriimothy S, MD, 5 mg at 07/16/18 0110   Physical Exam: AOx3, PERRL, EOMI, FS, Strength 5/5 - either 4+/5 or 5/5 on L - he has a lot of muscle bulk so it's tough to tell - can squeeze my fingers tight enough for it to hurt  Numbness resolved  Incision c/d/i, neck soft  Assessment & Plan: 38 y.o. man with progressive LUE/LLE weakness, MRI C-spine with large disc herniation and cord signal change, 7/5 s/p single level ACDF, recovering well.  -activity as tolerated, no cervical collar needed, can wear soft collar for comfort -advance diet as tolerated, no restrictions -PT/OT eval pending, okay for discharge home versus CIR from a neurosurgical perspective -okay for SQH starting today -will put my wound care / follow up  instructions / etc into his discharge instructions in Epic  Walnut Ridgehomas A Arline Ketter  07/17/18 11:21 AM

## 2018-07-17 NOTE — Evaluation (Signed)
Physical Therapy Evaluation/Discharge Patient Details Name: Bryan Matthews MRN: [id number] DOB: 1980-08-03 Today's Date: 07/17/2018   History of Present Illness  38 y.o. 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  Pt with significant PMH of asthma.    Clinical Impression  Pt mobilizing well with RW use.  Cervical precaution education initiated.  Pt able to demonstrate safety on stairs simulating home entry and walks safely with RW.  He has no further acute PT needs or follow up PT needs at this time.  I instructed him on toe raises (seated and standing) and LAQs with holds on the left.     Follow Up Recommendations No PT follow up    Equipment Recommendations  Rolling walker with 5" wheels    Recommendations for Other Services    NA   Precautions / Restrictions Precautions Precautions: Fall Precaution Comments: left sided weakness and numbness      Mobility  Bed Mobility Overal bed mobility: Modified Independent                Transfers Overall transfer level: Modified independent Equipment used: Rolling walker (2 wheeled)                Ambulation/Gait Ambulation/Gait assistance: Modified independent (Device/Increase time) Gait Distance (Feet): 200 Feet Assistive device: Rolling walker (2 wheeled) Gait Pattern/deviations: Step-through pattern;Decreased dorsiflexion - left;Decreased stance time - left(hyperextension moment at L knee with fatigue)   Gait velocity interpretation: 1.31 - 2.62 ft/sec, indicative of limited community ambulator    Stairs Stairs: Yes Stairs assistance: Modified independent (Device/Increase time) Stair Management: One rail Right;Step to pattern;Forwards Number of Stairs: 4 General stair comments: verbal cues for leading up wiht strong leg and down with weak leg, pt reports he did this PTA.  No issues completing stairs simulating home entry.           Balance Overall balance assessment: Mild deficits observed, not formally tested                                           Pertinent Vitals/Pain Pain Assessment: Faces Faces Pain Scale: Hurts little more Pain Location: anterior neck/incisional Pain Descriptors / Indicators: Aching;Burning Pain Intervention(s): Limited activity within patient's tolerance;Monitored during session;Repositioned    Home Living Family/patient expects to be discharged to:: Private residence Living Arrangements: Spouse/significant other;Children Available Help at Discharge: Family Type of Home: House Home Access: Stairs to enter Entrance Stairs-Rails: Right Entrance Stairs-Number of Steps: 3 Home Layout: One level Home Equipment: None      Prior Function Level of Independence: Independent               Hand Dominance   Dominant Hand: Right    Extremity/Trunk Assessment   Upper Extremity Assessment Upper Extremity Assessment: Defer to OT evaluation    Lower Extremity Assessment Lower Extremity Assessment: LLE deficits/detail LLE Deficits / Details: left leg with functional weakness, decreased foot clearance and hyperextension moment at the knee in stance.  LLE Sensation: decreased light touch    Cervical / Trunk Assessment Cervical / Trunk Assessment: Other exceptions Cervical / Trunk Exceptions: post op cervical fusion  Communication   Communication: No difficulties  Cognition Arousal/Alertness: Awake/alert Behavior During Therapy: WFL for tasks assessed/performed Overall Cognitive Status: Within Functional Limits for tasks assessed  General Comments General comments (skin integrity, edema, etc.): Verbally reviewed cervical precautions, pt needed some functional reminders of posture, cues to stay inside of RW for posture, and to not lean forward to spit while brushing his teeth.          Assessment/Plan    PT Assessment Patent does not need any further PT services         PT Goals (Current goals can be found in the Care Plan section)  Acute Rehab PT Goals PT Goal Formulation: All assessment and education complete, DC therapy     AM-PAC PT "6 Clicks" Mobility  Outcome Measure Help needed turning from your back to your side while in a flat bed without using bedrails?: None Help needed moving from lying on your back to sitting on the side of a flat bed without using bedrails?: None Help needed moving to and from a bed to a chair (including a wheelchair)?: None Help needed standing up from a chair using your arms (e.g., wheelchair or bedside chair)?: None Help needed to walk in hospital room?: None Help needed climbing 3-5 steps with a railing? : None 6 Click Score: 24    End of Session   Activity Tolerance: Patient tolerated treatment well Patient left: in bed;with call bell/phone within reach Nurse Communication: Mobility status PT Visit Diagnosis: Muscle weakness (generalized) (M62.81);Difficulty in walking, not elsewhere classified (R26.2);Other symptoms and signs involving the nervous system (U98.119(R29.898)    Time: [id number] PT Time Calculation (min) (ACUTE ONLY): 20 min   Charges:       Lurena Joinerebecca B. Daejon Lich, PT, DPT  Acute Rehabilitation [phone] pager [phone] office  @ [facility] [place]: [phone]   PT Evaluation $PT Eval Low Complexity: 1 Low         07/17/2018, 12:26 PM

## 2018-07-17 NOTE — Discharge Instructions (Signed)
Discharge Instructions ° °No restriction in activities, slowly increase your activity back to normal.  ° °Your incision is closed with dermabond (purple glue). This will naturally fall off over the next 1-2 weeks.  ° °Okay to shower on the day of discharge. Use regular soap and water and try to be gentle when cleaning your incision.  ° °Follow up with Dr. Vickye Astorino in 2 weeks after discharge. If you do not already have a discharge appointment, please call his office at [phone] to schedule a follow up appointment. If you have any concerns or questions, please call the office and let us know. °

## 2018-07-17 NOTE — Discharge Summary (Signed)
Physician Discharge Summary  Bryan Matthews ZOX:[id number] DOB: 1980-03-30 DOA: 07/14/2018  PCP: Wallis BambergMani, Mario, PA-C  Admit date: 07/14/2018 Discharge date: 07/17/2018  Admitted From: Home Disposition: Home Recommendations for Outpatient Follow-up:  1. Follow up with PCP in 1-2 weeks 2. Please obtain BMP/CBC in one week 3. Please follow up with dr Maurice Smallostergard  Home Health none  equipment/Devices: Rolling walker  Discharge Condition stable and improved CODE STATUS full code Diet recommendation: Cardiac Brief/Interim Summary:38 y.o. male with medical history significant for childhood asthma, tobacco abuse, and excessive daily alcohol use, now presenting to the emergency department for evaluation of left-sided numbness and weakness.  Patient reports a long history of chronic neck pain that has not limited his activity, but several months ago, he developed numbness and weakness involving the left arm and leg that prompted him to seek evaluation at an outpatient clinic where he had imaging and was told that he had arthritis and a pinched nerve.  Symptoms had improved and he was able to go back to his usual activities, but continued to have some numbness and weakness.  When he woke the morning of 07/13/2018, he noted a marked increase in numbness and weakness involving both upper and lower extremities on the left.  He reports falling a few times due to this and has been unable to use his left hand as usual.  He denies any recent injury or trauma.  He denies any fevers, chills, cough, shortness of breath, or sick contacts.  He reports smoking 1/2 pack/day but often thinks about quitting.  He reports drinking 2-6 alcoholic beverages every day, occasionally more, denies history of withdrawal but drinks every day. He has not needed an inhaler in 15 or 20 years. He apologizes about being rude with ED personnel earlier, explains that he was very frustrated due to his newly disabling neurologic deficits.   ED Course:  Upon arrival to the ED, patient is found to be afebrile, saturating well on room air, and with stable blood pressure.  EKG features a sinus rhythm with right axis deviation and nonspecific ST-T abnormality.  Chest x-ray is negative for acute cardiopulmonary disease and noncontrast head CT is negative for acute intracranial underbelly.  Chemistry panel is unremarkable and CBC notable for mild leukocytosis.  COVID-19 testing is negative.  Neurology was consulted by the ED physician and recommended MRI brain and cervical spine with and without contrast which is notable for normal brain but bulky disc herniation at C5-6 with cord mass-effect, and smaller disc herniations at C3-4 and mild spinal stenosis at C4-5.  Neurosurgery was consulted by the ED physician who anticipates surgical management of this and recommends a medical admission to the hospital    Discharge Diagnoses:  Principal Problem:   Cervical spinal cord compression (HCC)  1. 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 Tobacco and alcohol abuse  - Patient has been thinking about quitting both of these, encouraged to do so    Estimated body mass index is 27.88 kg/m as calculated from the following:   Height as of this encounter: 5\' 7"  (1.702 m).   Weight as of this encounter: 80.7 kg.  Discharge Instructions  Discharge Instructions    Call MD for:  persistant nausea  and vomiting   Complete by: As directed    Call MD for:  temperature >100.4   Complete by: As directed    Diet - low sodium heart healthy   Complete by: As directed    Diet - low sodium heart healthy   Complete by: As directed    Increase activity slowly   Complete by: As  directed    Increase activity slowly   Complete by: As directed      Allergies as of 07/17/2018   No Known Allergies     Medication List    STOP taking these medications   acetaminophen-codeine 300-30 MG tablet Commonly known as: TYLENOL #3     TAKE these medications   acetaminophen 325 MG tablet Commonly known as: TYLENOL Take 2 tablets (650 mg total) by mouth every 6 (six) hours as needed for mild pain (or Fever >/= 101).   cyclobenzaprine 5 MG tablet Commonly known as: FLEXERIL Take 5 mg by mouth 3 (three) times daily as needed for muscle spasms.   folic acid 1 MG tablet Commonly known as: FOLVITE Take 1 tablet (1 mg total) by mouth daily. Start taking on: July 18, 2018   HYDROcodone-acetaminophen 5-325 MG tablet Commonly known as: NORCO/VICODIN Take 1-2 tablets by mouth every 4 (four) hours as needed for moderate pain.   ibuprofen 200 MG tablet Commonly known as: ADVIL Take 400 mg by mouth every 6 (six) hours as needed (for pain).            Durable Medical Equipment  (From admission, onward)         Start     Ordered   07/17/18 1242  For home use only DME Walker rolling  [facility])  Once    Question:  Patient needs a walker to treat with the following condition  Answer:  Unsteady gait   07/17/18 1242   07/17/18 1140  For home use only DME Walker rolling  Once    Question:  Patient needs a walker to treat with the following condition  Answer:  Weakness   07/17/18 1139         Follow-up Information    Wallis Bamberg, PA-C Follow up.   Specialty: Urgent Care Contact information: [address] [place] [address] [phone]        Jadene Pierini, MD Follow up.   Specialty: Neurosurgery Contact information: [address] [place] [address] [phone]          No Known Allergies  Consultations: Neurosurgery Procedures/Studies: Dg Chest 2 View  Result Date: 07/14/2018 CLINICAL DATA:  Left-sided numbness. EXAM:  CHEST - 2 VIEW COMPARISON:  December 03, 2015 FINDINGS: The heart size and mediastinal contours are within normal limits. Both lungs are clear. The visualized skeletal structures are unremarkable. IMPRESSION: No active cardiopulmonary disease. Electronically Signed   By: Gerome Sam III M.D   On: 07/14/2018 21:12   Dg Cervical Spine 1 View  Result Date: 07/15/2018 CLINICAL DATA:  38 year old male undergoing cervical C5-C6 anterior cervical decompression/discectomy and fusion EXAM: DG CERVICAL SPINE - 1 VIEW COMPARISON:  MRI 07/14/2018 FINDINGS: Intraoperative cross-table lateral radiographs demonstrate interval C5-C6 anterior cervical discectomy and fusion. No evidence of immediate hardware complication. IMPRESSION: C5-C6 ACDF in progress. Electronically Signed   By: Malachy Moan M.D.   On: 07/15/2018 12:04   Ct Head Wo Contrast  Result Date: 07/14/2018 CLINICAL DATA:  Left-sided numbness. EXAM: CT HEAD WITHOUT CONTRAST TECHNIQUE: Contiguous axial images were  obtained from the base of the skull through the vertex without intravenous contrast. COMPARISON:  None. FINDINGS: Brain: Ventricles and sulci are appropriate for patient's age. No evidence for acute cortically based infarct, intracranial hemorrhage, mass lesion or mass-effect. Vascular: Unremarkable Skull: Intact Sinuses/Orbits: Paranasal sinuses are well aerated. Mastoid air cells are unremarkable. Orbits are unremarkable. Other: None. IMPRESSION: No acute intracranial process. Electronically Signed   By: Annia Beltrew  Davis M.D.   On: 07/14/2018 14:10   Mr Laqueta JeanBrain W And Wo Contrast  Addendum Date: 07/14/2018   ADDENDUM REPORT: 07/14/2018 22:44 ADDENDUM: Study discussed by telephone with PA Lyndel SafeElizabeth Hammond on 07/14/2018 at 22:43. We agreed on Neurosurgery consultation. Electronically Signed   By: Odessa FlemingH  Hall M.D.   On: 07/14/2018 22:44   Result Date: 07/14/2018 CLINICAL DATA:  38 year old male who awoke yesterday with left side weakness and numbness.  EXAM: MRI HEAD WITHOUT AND WITH CONTRAST MRI CERVICAL SPINE WITHOUT AND WITH CONTRAST TECHNIQUE: Multiplanar, multiecho pulse sequences of the brain and surrounding structures, and cervical spine, to include the craniocervical junction and cervicothoracic junction, were obtained without and with intravenous contrast. CONTRAST:  8 milliliters Gadavist COMPARISON:  Head CT without contrast earlier today. FINDINGS: MRI HEAD FINDINGS Brain: Cerebral volume is within normal limits. No restricted diffusion to suggest acute infarction. No midline shift, mass effect, evidence of mass lesion, ventriculomegaly, extra-axial collection or acute intracranial hemorrhage. Cervicomedullary junction and pituitary are within normal limits. Wallace CullensGray and white matter signal is within normal limits throughout the brain. No encephalomalacia or chronic blood products. No abnormal enhancement identified.  No dural thickening. Vascular: Major intracranial vascular flow voids are preserved. The major dural venous sinuses are enhancing and appear to be patent. Skull and upper cervical spine: Visualized bone marrow signal is within normal limits. Cervical spine is described below. Sinuses/Orbits: Negative orbits. Trace paranasal sinus mucosal thickening. Other: Mastoids are clear. Visible internal auditory structures appear normal. Scalp and face soft tissues appear negative. MRI CERVICAL SPINE FINDINGS Alignment: Straightening and mild reversal of cervical lordosis. No spondylolisthesis. Vertebrae: No marrow edema or evidence of acute osseous abnormality. Visualized bone marrow signal is within normal limits. Cord: Abnormal spinal cord signal at C5-C6 with increased T2 and STIR signal (series 26, image 8). 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 There is also faint post contrast enhancement of the cord just below the maximal level of compression, which is a feature described with compressive  myelopathy. No other abnormal intradural enhancement. No dural thickening. Visible spinal cord signal elsewhere is within normal limits. Posterior Fossa, vertebral arteries, paraspinal tissues: Cervicomedullary junction is within normal limits. Brain parenchyma described above. Preserved major vascular flow voids in the neck. Negative neck soft tissues. Negative visible right lung apex. Disc levels: C2-C3:  Negative. C3-C4: Borderline to mild disc bulging with superimposed small central to slightly right paracentral disc protrusion (series 27, image 16 and series 28, image 16. Mild spinal stenosis and ventral cord mass effect. No cord signal abnormality. Borderline to mild right greater than left C4 foraminal stenosis. C4-C5: Mild disc bulging. Mild spinal stenosis which appears in large part congenital due to short pedicle distance. Mild if any spinal cord mass effect and no cord signal abnormality. C5-C6: Bulky left paracentral disc extrusion (series 25, image 8 and series 27, image 27). With moderate spinal stenosis and spinal cord mass effect. Abnormal signal is detailed above. Superimposed disc bulging and endplate spurring. Mild to moderate left and mild right C6 foraminal stenosis. C6-C7:  Minimal disc bulge.  No significant stenosis. C7-T1:  Negative. Negative visible upper thoracic levels. IMPRESSION: 1.  Normal MRI appearance of the brain. 2. Abnormal cervical spine: 3. Bulky disc herniation at C5-C6 eccentric to the left with moderate spinal stenosis, moderate cord mass effect, and abnormal cord signal compatible with edema versus developing myelomalacia secondary to compressive myelopathy. 4. Smaller disc herniation at C3-C4 with up to mild spinal stenosis and cord mass effect. 5. 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 Electronically Signed: By: Odessa Fleming M.D. On: 07/14/2018 22:36   Mr Cervical Spine W Or Wo Contrast  Addendum Date: 07/14/2018   ADDENDUM  REPORT: 07/14/2018 22:44 ADDENDUM: Study discussed by telephone with PA Lyndel Safe on 07/14/2018 at 22:43. We agreed on Neurosurgery consultation. Electronically Signed   By: Odessa Fleming M.D.   On: 07/14/2018 22:44   Result Date: 07/14/2018 CLINICAL DATA:  38 year old male who awoke yesterday with left side weakness and numbness. EXAM: MRI HEAD WITHOUT AND WITH CONTRAST MRI CERVICAL SPINE WITHOUT AND WITH CONTRAST TECHNIQUE: Multiplanar, multiecho pulse sequences of the brain and surrounding structures, and cervical spine, to include the craniocervical junction and cervicothoracic junction, were obtained without and with intravenous contrast. CONTRAST:  8 milliliters Gadavist COMPARISON:  Head CT without contrast earlier today. FINDINGS: MRI HEAD FINDINGS Brain: Cerebral volume is within normal limits. No restricted diffusion to suggest acute infarction. No midline shift, mass effect, evidence of mass lesion, ventriculomegaly, extra-axial collection or acute intracranial hemorrhage. Cervicomedullary junction and pituitary are within normal limits. Wallace Cullens and white matter signal is within normal limits throughout the brain. No encephalomalacia or chronic blood products. No abnormal enhancement identified.  No dural thickening. Vascular: Major intracranial vascular flow voids are preserved. The major dural venous sinuses are enhancing and appear to be patent. Skull and upper cervical spine: Visualized bone marrow signal is within normal limits. Cervical spine is described below. Sinuses/Orbits: Negative orbits. Trace paranasal sinus mucosal thickening. Other: Mastoids are clear. Visible internal auditory structures appear normal. Scalp and face soft tissues appear negative. MRI CERVICAL SPINE FINDINGS Alignment: Straightening and mild reversal of cervical lordosis. No spondylolisthesis. Vertebrae: No marrow edema or evidence of acute osseous abnormality. Visualized bone marrow signal is within normal limits. Cord:  Abnormal spinal cord signal at C5-C6 with increased T2 and STIR signal (series 26, image 8). 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 There is also faint post contrast enhancement of the cord just below the maximal level of compression, which is a feature described with compressive myelopathy. No other abnormal intradural enhancement. No dural thickening. Visible spinal cord signal elsewhere is within normal limits. Posterior Fossa, vertebral arteries, paraspinal tissues: Cervicomedullary junction is within normal limits. Brain parenchyma described above. Preserved major vascular flow voids in the neck. Negative neck soft tissues. Negative visible right lung apex. Disc levels: C2-C3:  Negative. C3-C4: Borderline to mild disc bulging with superimposed small central to slightly right paracentral disc protrusion (series 27, image 16 and series 28, image 16. Mild spinal stenosis and ventral cord mass effect. No cord signal abnormality. Borderline to mild right greater than left C4 foraminal stenosis. C4-C5: Mild disc bulging. Mild spinal stenosis which appears in large part congenital due to short pedicle distance. Mild if any spinal cord mass effect and no cord signal abnormality. C5-C6: Bulky left paracentral disc extrusion (series 25, image 8 and series 27, image 27). With moderate spinal stenosis and spinal cord mass  effect. Abnormal signal is detailed above. Superimposed disc bulging and endplate spurring. Mild to moderate left and mild right C6 foraminal stenosis. C6-C7:  Minimal disc bulge.  No significant stenosis. C7-T1:  Negative. Negative visible upper thoracic levels. IMPRESSION: 1.  Normal MRI appearance of the brain. 2. Abnormal cervical spine: 3. Bulky disc herniation at C5-C6 eccentric to the left with moderate spinal stenosis, moderate cord mass effect, and abnormal cord signal compatible with edema versus developing myelomalacia secondary to compressive  myelopathy. 4. Smaller disc herniation at C3-C4 with up to mild spinal stenosis and cord mass effect. 5. 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 Electronically Signed: By: Genevie Ann M.D. On: 07/14/2018 22:36   Dg C-arm 1-60 Min  Result Date: 07/15/2018 CLINICAL DATA:  C5-6 cervical decompression. FLUOROSCOPY TIME:  18 seconds. EXAM: DG C-ARM 61-120 MIN COMPARISON:  None. FINDINGS: Anterior plate and screws have been affixed to the cervical spine at C5 and C6. Hardware is in good position. It appears bone plug material is been placed but it is not well assessed. IMPRESSION: 1. Anterior plate and screws have been placed at C5 and C6, in good position. 2. It appears bone plug material may have been placed at the C5-6 level but the exact location of this material is not well assessed on provided images. Electronically Signed   By: Dorise Bullion III M.D   On: 07/15/2018 12:05    (Echo, Carotid, EGD, Colonoscopy, ERCP)    Subjective: Patient resting in bed moving the left upper and lower extremity better than yesterday anxious to go home walked with therapy  Discharge Exam: Vitals:   07/17/18 0350 07/17/18 0837  BP: (!) 140/94 129/78  Pulse: 72 67  Resp: 20 18  Temp: 98.1 F (36.7 C) 97.9 F (36.6 C)  SpO2: 100% 99%   Vitals:   07/16/18 2122 07/16/18 2322 07/17/18 0350 07/17/18 0837  BP: (!) 144/90 (!) 146/100 (!) 140/94 129/78  Pulse: 71 68 72 67  Resp:  20 20 18   Temp:  98.3 F (36.8 C) 98.1 F (36.7 C) 97.9 F (36.6 C)  TempSrc:  Oral Oral Oral  SpO2:  100% 100% 99%  Weight: 80.7 kg     Height: 5\' 7"  (1.702 m)       General: Pt is alert, awake, not in acute distress Cardiovascular: RRR, S1/S2 +, no rubs, no gallops Respiratory: CTA bilaterally, no wheezing, no rhonchi Abdominal: Soft, NT, ND, bowel sounds + Extremities: no edema, no cyanosis    The results of significant diagnostics from this hospitalization (including imaging,  microbiology, ancillary and laboratory) are listed below for reference.     Microbiology: Recent Results (from the past 240 hour(s))  SARS Coronavirus 2 (CEPHEID - Performed in [facility] hospital lab), Hosp Order     Status: None   Collection Time: 07/14/18  5:59 PM   Specimen: Nasopharyngeal Swab  Result Value Ref Range Status   SARS Coronavirus 2 NEGATIVE NEGATIVE Final    Comment: (NOTE) If result is NEGATIVE SARS-CoV-2 target nucleic acids are NOT DETECTED. The SARS-CoV-2 RNA is generally detectable in upper and lower  respiratory specimens during the acute phase of infection. The lowest  concentration of SARS-CoV-2 viral copies this assay can detect is 250  copies / mL. A negative result does not preclude SARS-CoV-2 infection  and should not be used as the sole basis for treatment or other  patient management decisions.  A negative result may  occur with  improper specimen collection / handling, submission of specimen other  than nasopharyngeal swab, presence of viral mutation(s) within the  areas targeted by this assay, and inadequate number of viral copies  (<250 copies / mL). A negative result must be combined with clinical  observations, patient history, and epidemiological information. If result is POSITIVE SARS-CoV-2 target nucleic acids are DETECTED. The SARS-CoV-2 RNA is generally detectable in upper and lower  respiratory specimens dur ing the acute phase of infection.  Positive  results are indicative of active infection with SARS-CoV-2.  Clinical  correlation with patient history and other diagnostic information is  necessary to determine patient infection status.  Positive results do  not rule out bacterial infection or co-infection with other viruses. If result is PRESUMPTIVE POSTIVE SARS-CoV-2 nucleic acids MAY BE PRESENT.   A presumptive positive result was obtained on the submitted specimen  and confirmed on repeat testing.  While 2019 novel coronavirus   (SARS-CoV-2) nucleic acids may be present in the submitted sample  additional confirmatory testing may be necessary for epidemiological  and / or clinical management purposes  to differentiate between  SARS-CoV-2 and other Sarbecovirus currently known to infect humans.  If clinically indicated additional testing with an alternate test  methodology [phone]([id number]) is advised. The SARS-CoV-2 RNA is generally  detectable in upper and lower respiratory sp ecimens during the acute  phase of infection. The expected result is Negative. Fact Sheet for Patients:  BoilerBrush.com.cyhttps://www.fda.gov/media/136312/download Fact Sheet for Healthcare Providers: https://pope.com/https://www.fda.gov/media/136313/download This test is not yet approved or cleared by the [place] FDA and has been authorized for detection and/or diagnosis of SARS-CoV-2 by FDA under an Emergency Use Authorization (EUA).  This EUA will remain in effect (meaning this test can be used) for the duration of the COVID-19 declaration under Section 564(b)(1) of the Act, 21 U.S.C. section 360bbb-3(b)(1), unless the authorization is terminated or revoked sooner. Performed at [facility], [address] [place], [place] [address]      Labs: BNP (last 3 results) No results for input(s): BNP in the last 8760 hours. Basic Metabolic Panel: Recent Labs  Lab 07/14/18 1420 07/15/18 0732  NA 138 139  K 3.5 3.3*  CL 104 102  CO2 27 27  GLUCOSE 90 94  BUN 10 8  CREATININE 0.90 1.00  CALCIUM 9.0 8.9   Liver Function Tests: Recent Labs  Lab 07/14/18 1420  AST 18  ALT 15  ALKPHOS 88  BILITOT 0.9  PROT 7.2  ALBUMIN 4.0   No results for input(s): LIPASE, AMYLASE in the last 168 hours. No results for input(s): AMMONIA in the last 168 hours. CBC: Recent Labs  Lab 07/14/18 1420 07/15/18 0732  WBC 11.1* 9.3  NEUTROABS 7.9*  --   HGB 15.9 15.4  HCT 47.8 45.5  MCV 94.5 92.3  PLT 248 242   Cardiac Enzymes: No results for input(s): CKTOTAL,  CKMB, CKMBINDEX, TROPONINI in the last 168 hours. BNP: Invalid input(s): POCBNP CBG: No results for input(s): GLUCAP in the last 168 hours. D-Dimer No results for input(s): DDIMER in the last 72 hours. Hgb A1c No results for input(s): HGBA1C in the last 72 hours. Lipid Profile No results for input(s): CHOL, HDL, LDLCALC, TRIG, CHOLHDL, LDLDIRECT in the last 72 hours. Thyroid function studies No results for input(s): TSH, T4TOTAL, T3FREE, THYROIDAB in the last 72 hours.  Invalid input(s): FREET3 Anemia work up No results for input(s): VITAMINB12, FOLATE, FERRITIN, TIBC, IRON, RETICCTPCT in the last 72  hours. Urinalysis    Component Value Date/Time   COLORURINE YELLOW 12/02/2010 1200   APPEARANCEUR CLEAR 12/02/2010 1200   LABSPEC 1.019 12/02/2010 1200   PHURINE 6.0 12/02/2010 1200   GLUCOSEU NEGATIVE 12/02/2010 1200   HGBUR NEGATIVE 12/02/2010 1200   BILIRUBINUR NEGATIVE 12/02/2010 1200   KETONESUR NEGATIVE 12/02/2010 1200   PROTEINUR NEGATIVE 12/02/2010 1200   UROBILINOGEN 0.2 12/02/2010 1200   NITRITE NEGATIVE 12/02/2010 1200   LEUKOCYTESUR NEGATIVE 12/02/2010 1200   Sepsis Labs Invalid input(s): PROCALCITONIN,  WBC,  LACTICIDVEN Microbiology Recent Results (from the past 240 hour(s))  SARS Coronavirus 2 (CEPHEID - Performed in [facility] hospital lab), Hosp Order     Status: None   Collection Time: 07/14/18  5:59 PM   Specimen: Nasopharyngeal Swab  Result Value Ref Range Status   SARS Coronavirus 2 NEGATIVE NEGATIVE Final    Comment: (NOTE) If result is NEGATIVE SARS-CoV-2 target nucleic acids are NOT DETECTED. The SARS-CoV-2 RNA is generally detectable in upper and lower  respiratory specimens during the acute phase of infection. The lowest  concentration of SARS-CoV-2 viral copies this assay can detect is 250  copies / mL. A negative result does not preclude SARS-CoV-2 infection  and should not be used as the sole basis for treatment or other  patient management  decisions.  A negative result may occur with  improper specimen collection / handling, submission of specimen other  than nasopharyngeal swab, presence of viral mutation(s) within the  areas targeted by this assay, and inadequate number of viral copies  (<250 copies / mL). A negative result must be combined with clinical  observations, patient history, and epidemiological information. If result is POSITIVE SARS-CoV-2 target nucleic acids are DETECTED. The SARS-CoV-2 RNA is generally detectable in upper and lower  respiratory specimens dur ing the acute phase of infection.  Positive  results are indicative of active infection with SARS-CoV-2.  Clinical  correlation with patient history and other diagnostic information is  necessary to determine patient infection status.  Positive results do  not rule out bacterial infection or co-infection with other viruses. If result is PRESUMPTIVE POSTIVE SARS-CoV-2 nucleic acids MAY BE PRESENT.   A presumptive positive result was obtained on the submitted specimen  and confirmed on repeat testing.  While 2019 novel coronavirus  (SARS-CoV-2) nucleic acids may be present in the submitted sample  additional confirmatory testing may be necessary for epidemiological  and / or clinical management purposes  to differentiate between  SARS-CoV-2 and other Sarbecovirus currently known to infect humans.  If clinically indicated additional testing with an alternate test  methodology [phone]) is advised. The SARS-CoV-2 RNA is generally  detectable in upper and lower respiratory sp ecimens during the acute  phase of infection. The expected result is Negative. Fact Sheet for Patients:  BoilerBrush.com.cy Fact Sheet for Healthcare Providers: https://pope.com/ This test is not yet approved or cleared by the [place] FDA and has been authorized for detection and/or diagnosis of SARS-CoV-2 by FDA under an  Emergency Use Authorization (EUA).  This EUA will remain in effect (meaning this test can be used) for the duration of the COVID-19 declaration under Section 564(b)(1) of the Act, 21 U.S.C. section 360bbb-3(b)(1), unless the authorization is terminated or revoked sooner. Performed at [facility], [address], [place], [place] [address]      Time coordinating discharge: 33 minutes  SIGNED:   Alwyn Ren, MD  [facility] 07/17/2018, 12:42 PM Pager   If 7PM-7AM,  please contact night-coverage [email or web address] Password [email or web address]

## 2019-05-15 ENCOUNTER — Ambulatory Visit
Admission: EM | Admit: 2019-05-15 | Discharge: 2019-05-15 | Disposition: A | Discharge status: Home / Self Care | Payer: BC Managed Care – PPO | Attending: Physician Assistant | Admitting: Physician Assistant

## 2019-05-15 DIAGNOSIS — G44311 Acute post-traumatic headache, intractable: Secondary | ICD-10-CM

## 2019-05-15 MED ORDER — METOCLOPRAMIDE HCL 5 MG/ML IJ SOLN
5.0000 mg | Freq: Once | INTRAMUSCULAR | Status: AC
  Administered 2019-05-15: 5 mg via INTRAMUSCULAR

## 2019-05-15 MED ORDER — DEXAMETHASONE SODIUM PHOSPHATE 10 MG/ML IJ SOLN
10.0000 mg | Freq: Once | INTRAMUSCULAR | Status: AC
  Administered 2019-05-15: 10 mg via INTRAMUSCULAR

## 2019-05-15 MED ORDER — KETOROLAC TROMETHAMINE 15 MG/ML IJ SOLN
15.0000 mg | Freq: Once | INTRAMUSCULAR | Status: AC
  Administered 2019-05-15: 15 mg via INTRAMUSCULAR

## 2019-05-15 NOTE — Discharge Instructions (Signed)
No alarming signs on your exam. Toradol, reglan, decadron injection in office today. Rest as much as you can. Stay hydrated. You can take ibuprofen 800mg  three times a day/tylenol 1000mg  three times a day as needed for further headaches. If continues with headaches, please follow up with PCP/concussion clinic for further evaluation. If sudden worsening of headache with blurry vision, nausea/vomiting, confusion/altered mental status, dizziness, weakness, passing out, imbalance, go to the emergency department for further evaluation.

## 2019-05-15 NOTE — ED Provider Notes (Signed)
[facility]    CSN: [id number] Arrival date & time: 05/15/19  1318      History   Chief Complaint Chief Complaint  Patient presents with  . Headache    HPI Bryan Matthews is a 39 y.o. male.   39 year old male comes in for headache after head injury 4 days ago.  Patient was at work, was bent over, and when standing up hit his head to the crane.  Denies loss of consciousness.  Since then, he has had intermittent bilateral temporal headaches that is pounding in sensation, with photophobia.  Denies nausea, vomiting, phonophobia.  Denies dizziness, weakness, syncope.  Denies trouble focusing, memory changes.  Ibuprofen 400 mg with temporary relief.     Past Medical History:  Diagnosis Date  . Asthma     Patient Active Problem List   Diagnosis Date Noted  . Cervical spinal cord compression ([place]) 07/15/2018  . Alcohol abuse   . Tobacco abuse   . Compression of spinal cord ([place]) 07/14/2018    Past Surgical History:  Procedure Laterality Date  . ANTERIOR CERVICAL DECOMP/DISCECTOMY FUSION N/A 07/15/2018   Procedure: CERVICAL FIVE-SIX ANTERIOR CERVICAL DECOMPRESSION/DISCECTOMY FUSION;  Surgeon: Judith Part, MD;  Location: [facility];  Service: Neurosurgery;  Laterality: N/A;       Home Medications    Prior to Admission medications   Not on File    Family History History reviewed. No pertinent family history.  Social History Social History   Tobacco Use  . Smoking status: Current Every Day Smoker    Packs/day: 1.00  . Smokeless tobacco: Never Used  Substance Use Topics  . Alcohol use: Yes    Alcohol/week: 12.0 standard drinks    Types: 12 Cans of beer per week  . Drug use: Not Currently     Allergies   Patient has no known allergies.   Review of Systems Review of Systems  Reason unable to perform ROS: See HPI as above.     Physical Exam Triage Vital Signs ED Triage Vitals  Enc Vitals Group     BP 05/15/19 1332 (!) 180/106   Pulse Rate 05/15/19 1332 86     Resp 05/15/19 1332 18     Temp 05/15/19 1332 98.5 F (36.9 C)     Temp Source 05/15/19 1332 Oral     SpO2 05/15/19 1332 96 %     Weight --      Height --      Head Circumference --      Peak Flow --      Pain Score 05/15/19 1333 7     Pain Loc --      Pain Edu? --      Excl. in [place]? --    No data found.  Updated Vital Signs BP (!) 180/106 (BP Location: Left Arm)   Pulse 86   Temp 98.5 F (36.9 C) (Oral)   Resp 18   SpO2 96%   Physical Exam Constitutional:      General: He is not in acute distress.    Appearance: Normal appearance. He is not ill-appearing, toxic-appearing or diaphoretic.  HENT:     Head: Normocephalic and atraumatic.     Mouth/Throat:     Mouth: Mucous membranes are moist.     Pharynx: Oropharynx is clear. Uvula midline.  Eyes:     Extraocular Movements: Extraocular movements intact.     Conjunctiva/sclera: Conjunctivae normal.     Pupils: Pupils are equal, round,  and reactive to light.  Cardiovascular:     Rate and Rhythm: Normal rate and regular rhythm.     Heart sounds: Normal heart sounds. No murmur. No friction rub. No gallop.   Pulmonary:     Effort: Pulmonary effort is normal. No accessory muscle usage, prolonged expiration, respiratory distress or retractions.     Comments: Lungs clear to auscultation without adventitious lung sounds. Musculoskeletal:     Cervical back: Normal range of motion and neck supple. No pain with movement, spinous process tenderness or muscular tenderness.  Neurological:     General: No focal deficit present.     Mental Status: He is alert and oriented to person, place, and time.     GCS: GCS eye subscore is 4. GCS verbal subscore is 5. GCS motor subscore is 6.     Comments: Cranial nerves II-XII grossly intact. Strength 5/5 bilaterally for upper and lower extremity. Sensation intact. Normal coordination with normal finger to nose, heel to shin. Negative pronator drift, romberg. Gait  intact. Able to ambulate on own without difficulty.       UC Treatments / Results  Labs (all labs ordered are listed, but only abnormal results are displayed) Labs Reviewed - No data to display  EKG   Radiology No results found.  Procedures Procedures (including critical care time)  Medications Ordered in UC Medications  metoCLOPramide (REGLAN) injection 5 mg (5 mg Intramuscular Given 05/15/19 1410)  dexamethasone (DECADRON) injection 10 mg (10 mg Intramuscular Given 05/15/19 1410)  ketorolac (TORADOL) 15 MG/ML injection 15 mg (15 mg Intramuscular Given 05/15/19 1410)    Initial Impression / Assessment and Plan / UC Course  I have reviewed the triage vital signs and the nursing notes.  Pertinent labs & imaging results that were available during my care of the patient were reviewed by me and considered in my medical decision making (see chart for details).    Neurology exam grossly intact. Continued intermittent headache without significant worsening. No nausea/vomiting. No blood thinner use. Low suspicion for intracranial bleed. Will provide symptomatic treatment with toradol, reglan, decadron injection in office today. Return precautions given. Patient expresses understanding and agrees to plan.  Final Clinical Impressions(s) / UC Diagnoses   Final diagnoses:  Intractable acute post-traumatic headache    ED Prescriptions    None     PDMP not reviewed this encounter.   Belinda Fisher, PA-C 05/15/19 1600

## 2019-05-15 NOTE — ED Triage Notes (Signed)
Pt states hit the top of his head on a crane 4 days ago, c/o severe pain to both temples. Denies LOC. States taking ibuprofen with relief for a short period of time.

## 2020-04-27 ENCOUNTER — Other Ambulatory Visit: Payer: Self-pay

## 2020-04-27 ENCOUNTER — Ambulatory Visit
Admission: EM | Admit: 2020-04-27 | Discharge: 2020-04-27 | Disposition: A | Discharge status: Home / Self Care | Payer: BC Managed Care – PPO | Attending: Family Medicine | Admitting: Family Medicine

## 2020-04-27 DIAGNOSIS — R519 Headache, unspecified: Secondary | ICD-10-CM

## 2020-04-27 MED ORDER — TIZANIDINE HCL 4 MG PO TABS: 4.0000 mg | ORAL_TABLET | Freq: Every day | ORAL | 0 refills | Status: AC

## 2020-04-27 MED ORDER — DEXAMETHASONE SODIUM PHOSPHATE 10 MG/ML IJ SOLN
10.0000 mg | Freq: Once | INTRAMUSCULAR | Status: AC
  Administered 2020-04-27: 10 mg via INTRAMUSCULAR

## 2020-04-27 MED ORDER — KETOROLAC TROMETHAMINE 30 MG/ML IJ SOLN
30.0000 mg | Freq: Once | INTRAMUSCULAR | Status: AC
  Administered 2020-04-27: 30 mg via INTRAMUSCULAR

## 2020-04-27 NOTE — ED Triage Notes (Signed)
Pt presents with c/o headache that began yesterday , pt has h/o migraine
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
## 2020-07-26 IMAGING — MR MRI HEAD WITHOUT AND WITH CONTRAST
18 of 24 series · 24 of 48 positions shown · IV contrast (gadavist)
Comparison: Head CT without contrast earlier today.
COMPARISON: Head CT without contrast earlier today.

Addendum:
CLINICAL DATA: 37-year-old male who awoke yesterday with left side
weakness and numbness.

EXAM:
MRI HEAD WITHOUT AND WITH CONTRAST
MRI CERVICAL SPINE WITHOUT AND WITH CONTRAST
TECHNIQUE: Multiplanar, multiecho pulse sequences of the brain and surrounding
structures, and cervical spine, to include the craniocervical
junction and cervicothoracic junction, were obtained without and
with intravenous contrast.
CONTRAST:  8 milliliters Gadavist

[Series 5: DWI · axial · 3.0mm · 0.88mm/px · z∈[-78,+55]mm · 3 of 92 slices shown (1 of 4)]
[im 1/92]
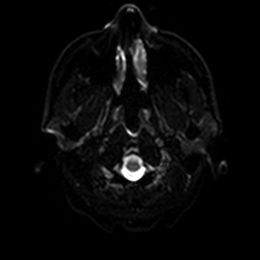
[im 46/92]
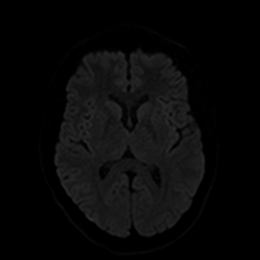
[im 92/92]
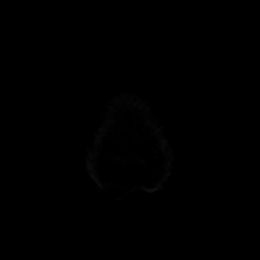

[Series 6: DWI · axial · 3.0mm · 0.88mm/px · z∈[-78,+55]mm · 2 of 46 slices shown (2 of 4)]
[im 1/46]
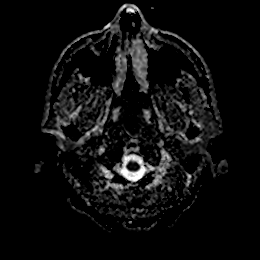
[im 46/46]
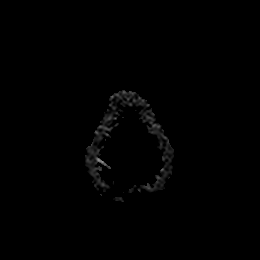

[Series 7: DWI · coronal · 4.0mm · 0.88mm/px · 2 of 68 slices shown (3 of 4)]
[im 1/68]
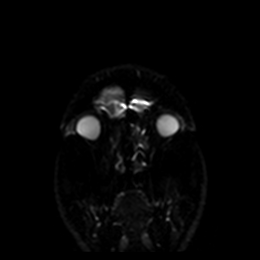
[im 68/68]
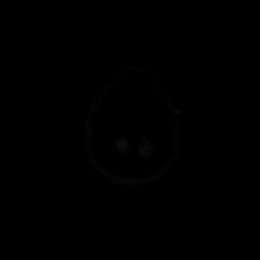

[Series 8: DWI · coronal · 4.0mm · 0.88mm/px · 1 of 34 slices shown (4 of 4)]
[im 1/34]
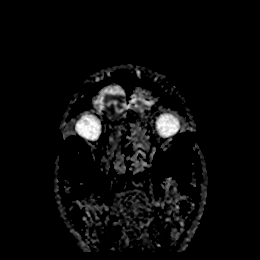

[Series 9: T1 · sagittal · 5.0mm · 0.75mm/px · 1 of 25 slices shown (1 of 3)]
[im 1/25]
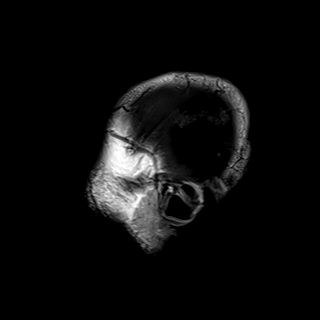

[Series 10: [id number]-ir · sagittal · 1.0mm · 0.49mm/px · 3 of 192 slices shown]
[im 1/192]
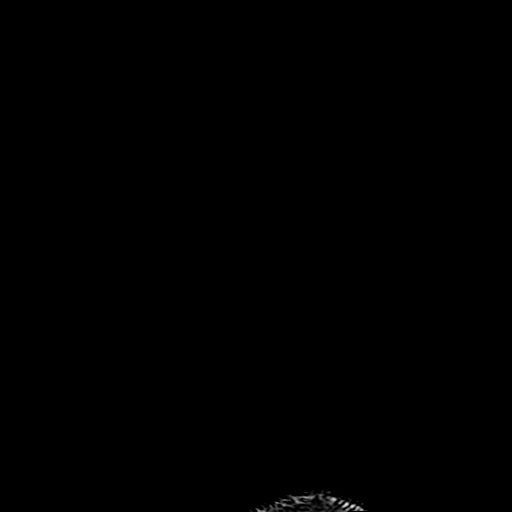
[im 32/192]
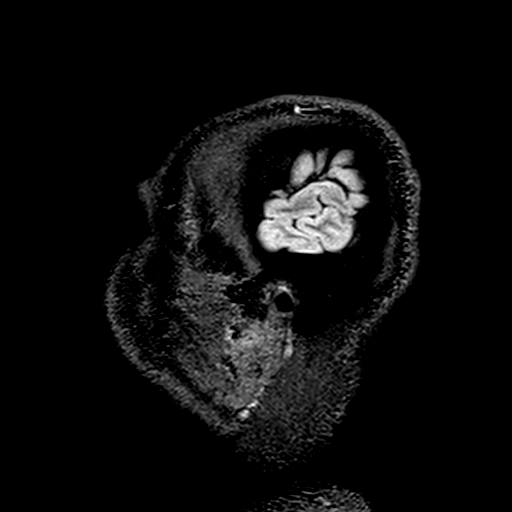
[im 64/192]
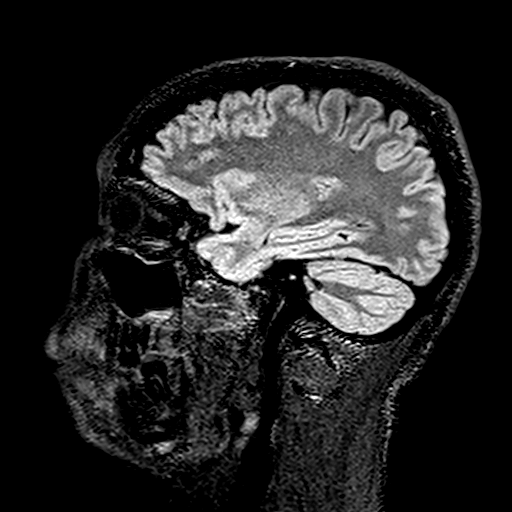

[Series 11: T2 · axial · 5.0mm · 0.72mm/px · 1 of 25 slices shown (1 of 3)]
[im 1/25]
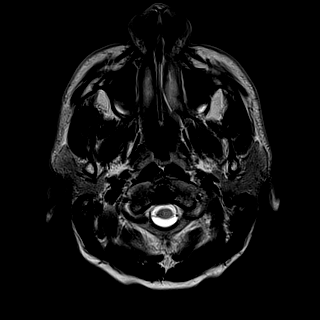

[Series 14: FLAIR · axial · 5.0mm · 0.45mm/px · 1 of 25 slices shown]
[im 1/25]
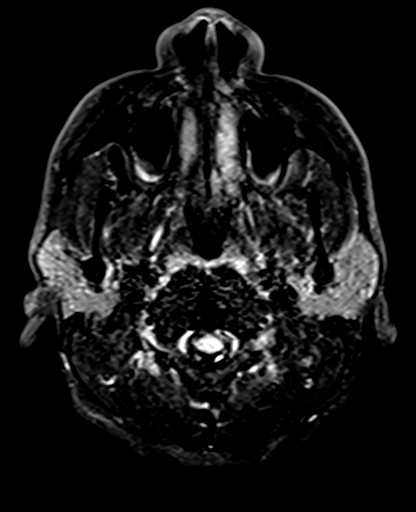

[Series 24: T2 · sagittal · 3.0mm · 0.69mm/px · 1 of 15 slices shown (2 of 3)]
[im 1/15]
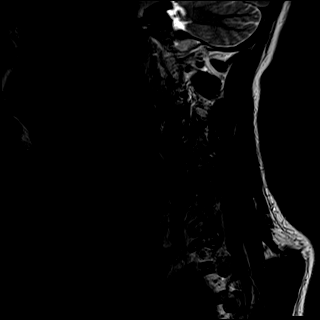

[Series 25: T1 · sagittal · 3.0mm · 0.69mm/px · 1 of 15 slices shown (2 of 3)]
[im 1/15]
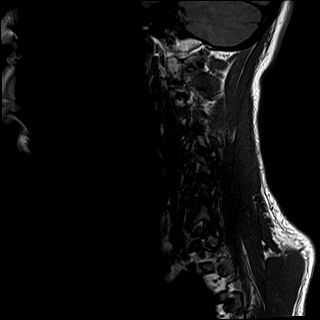

[Series 26: STIR · sagittal · 3.0mm · 0.86mm/px · 1 of 15 slices shown]
[im 1/15]
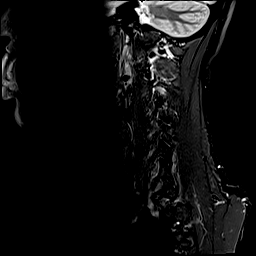

[Series 27: T2 · axial · 3.0mm · 0.66mm/px · 1 of 40 slices shown (3 of 3)]
[im 1/40]
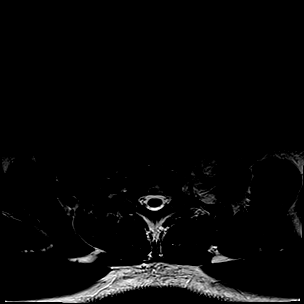

[Series 28: GRE · axial · 3.0mm · 0.39mm/px · 1 of 40 slices shown]
[im 1/40]
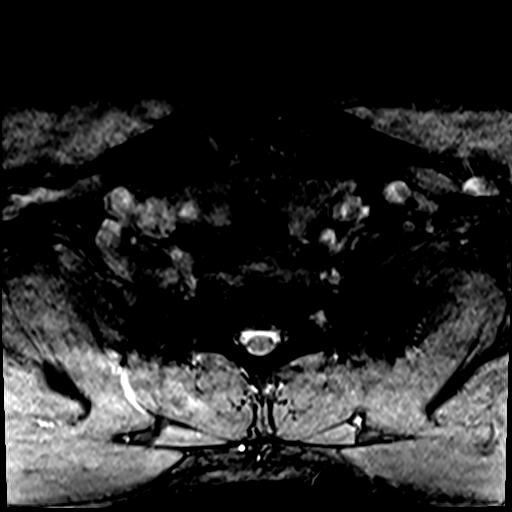

[Series 29: T1 · axial · 3.0mm · 0.39mm/px · 1 of 40 slices shown (3 of 3)]
[im 1/40]
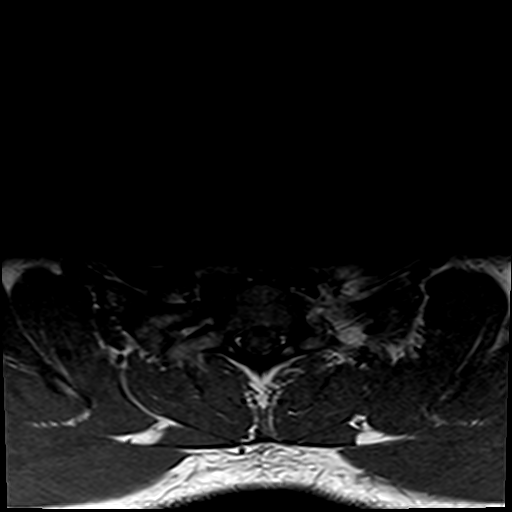

[Series 30: T1 fat-sat post-contrast · sagittal · 3.0mm · 0.43mm/px · 1 of 15 slices shown]
[im 1/15]
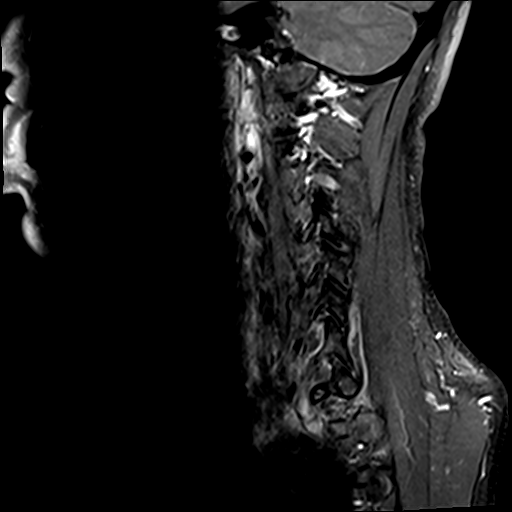

[Series 31: T1 post-contrast · axial · 3.0mm · 0.39mm/px · 1 of 40 slices shown (1 of 2)]
[im 1/40]
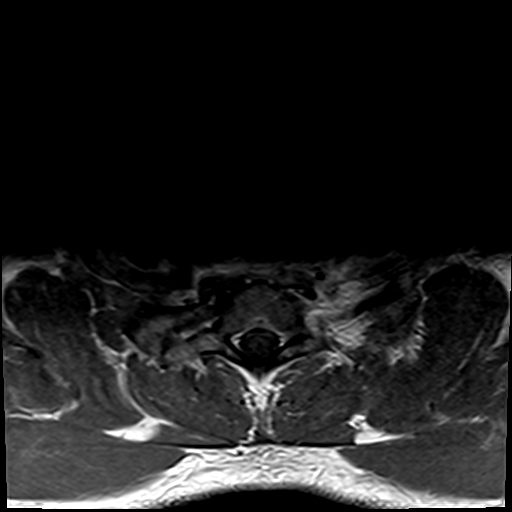

[Series 32: T2 post-contrast · coronal · 5.0mm · 0.72mm/px · 1 of 30 slices shown]
[im 1/30]
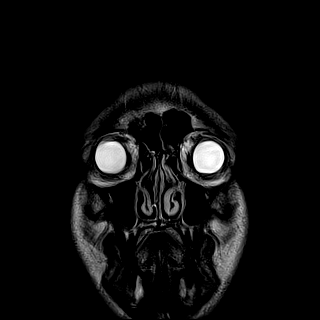

[Series 35: T1 post-contrast · sagittal · 5.0mm · 0.75mm/px · 1 of 25 slices shown (2 of 2)]
[im 1/25]
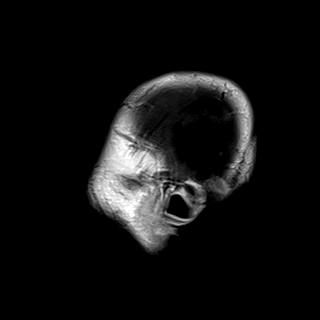

[24 of 48 positions shown; findings below may reference images not displayed]

FINDINGS: MRI HEAD FINDINGS

Brain: Cerebral volume is within normal limits. No restricted
diffusion to suggest acute infarction. No midline shift, mass
effect, evidence of mass lesion, ventriculomegaly, extra-axial
collection or acute intracranial hemorrhage. Cervicomedullary
junction and pituitary are within normal limits.

Gray and white matter signal is within normal limits throughout the
brain. No encephalomalacia or chronic blood products.

No abnormal enhancement identified.  No dural thickening.

Vascular: Major intracranial vascular flow voids are preserved. The
major dural venous sinuses are enhancing and appear to be patent.

Skull and upper cervical spine: Visualized bone marrow signal is
within normal limits. Cervical spine is described below.

Sinuses/Orbits: Negative orbits. Trace paranasal sinus mucosal
thickening.

Other: Mastoids are clear. Visible internal auditory structures
appear normal. Scalp and face soft tissues appear negative.

MRI CERVICAL SPINE FINDINGS

Alignment: Straightening and mild reversal of cervical lordosis. No
spondylolisthesis.

Vertebrae: No marrow edema or evidence of acute osseous abnormality.
Visualized bone marrow signal is within normal limits.

Cord: Abnormal spinal cord signal at C5-C6 with increased T2 and
STIR signal (series 26, image 8). Which appears related to
compressive myelopathy secondary to disc herniation, see below.
There is also faint post contrast enhancement of the cord just below
the maximal level of compression, which is a feature described with
compressive myelopathy.

No other abnormal intradural enhancement. No dural thickening.
Visible spinal cord signal elsewhere is within normal limits.

Posterior Fossa, vertebral arteries, paraspinal tissues:
Cervicomedullary junction is within normal limits. Brain parenchyma
described above.

Preserved major vascular flow voids in the neck. Negative neck soft
tissues. Negative visible right lung apex.

Disc levels:

C2-C3:  Negative.

C3-C4: Borderline to mild disc bulging with superimposed small
central to slightly right paracentral disc protrusion (series 27,
image 16 and series 28, image 16. Mild spinal stenosis and ventral
cord mass effect. No cord signal abnormality. Borderline to mild
right greater than left C4 foraminal stenosis.

C4-C5: Mild disc bulging. Mild spinal stenosis which appears in
large part congenital due to short pedicle distance. Mild if any
spinal cord mass effect and no cord signal abnormality.

C5-C6: Bulky left paracentral disc extrusion (series 25, image 8 and
series 27, image 27). With moderate spinal stenosis and spinal cord
mass effect. Abnormal signal is detailed above. Superimposed disc
bulging and endplate spurring. Mild to moderate left and mild right
C6 foraminal stenosis.

C6-C7:  Minimal disc bulge.  No significant stenosis.

C7-T1:  Negative.

Negative visible upper thoracic levels.
IMPRESSION: 1.  Normal MRI appearance of the brain.
2. Abnormal cervical spine:
3. Bulky disc herniation at C5-C6 eccentric to the left with
moderate spinal stenosis, moderate cord mass effect, and abnormal
cord signal compatible with edema versus developing myelomalacia
secondary to compressive myelopathy.
4. Smaller disc herniation at C3-C4 with up to mild spinal stenosis
and cord mass effect.
5. Mild spinal stenosis at C4-C5 related to disc bulging plus
congenital short pedicles.

ADDENDUM:
Study discussed by telephone with PA Kayy Turgoose on 07/14/2018
at [DATE]. We agreed on Neurosurgery consultation.

*** End of Addendum ***
FINDINGS: MRI HEAD FINDINGS

Brain: Cerebral volume is within normal limits. No restricted
diffusion to suggest acute infarction. No midline shift, mass
effect, evidence of mass lesion, ventriculomegaly, extra-axial
collection or acute intracranial hemorrhage. Cervicomedullary
junction and pituitary are within normal limits.

Gray and white matter signal is within normal limits throughout the
brain. No encephalomalacia or chronic blood products.

No abnormal enhancement identified.  No dural thickening.

Vascular: Major intracranial vascular flow voids are preserved. The
major dural venous sinuses are enhancing and appear to be patent.

Skull and upper cervical spine: Visualized bone marrow signal is
within normal limits. Cervical spine is described below.

Sinuses/Orbits: Negative orbits. Trace paranasal sinus mucosal
thickening.

Other: Mastoids are clear. Visible internal auditory structures
appear normal. Scalp and face soft tissues appear negative.

MRI CERVICAL SPINE FINDINGS

Alignment: Straightening and mild reversal of cervical lordosis. No
spondylolisthesis.

Vertebrae: No marrow edema or evidence of acute osseous abnormality.
Visualized bone marrow signal is within normal limits.

Cord: Abnormal spinal cord signal at C5-C6 with increased T2 and
STIR signal (series 26, image 8). Which appears related to
compressive myelopathy secondary to disc herniation, see below.
There is also faint post contrast enhancement of the cord just below
the maximal level of compression, which is a feature described with
compressive myelopathy.

No other abnormal intradural enhancement. No dural thickening.
Visible spinal cord signal elsewhere is within normal limits.

Posterior Fossa, vertebral arteries, paraspinal tissues:
Cervicomedullary junction is within normal limits. Brain parenchyma
described above.

Preserved major vascular flow voids in the neck. Negative neck soft
tissues. Negative visible right lung apex.

Disc levels:

C2-C3:  Negative.

C3-C4: Borderline to mild disc bulging with superimposed small
central to slightly right paracentral disc protrusion (series 27,
image 16 and series 28, image 16. Mild spinal stenosis and ventral
cord mass effect. No cord signal abnormality. Borderline to mild
right greater than left C4 foraminal stenosis.

C4-C5: Mild disc bulging. Mild spinal stenosis which appears in
large part congenital due to short pedicle distance. Mild if any
spinal cord mass effect and no cord signal abnormality.

C5-C6: Bulky left paracentral disc extrusion (series 25, image 8 and
series 27, image 27). With moderate spinal stenosis and spinal cord
mass effect. Abnormal signal is detailed above. Superimposed disc
bulging and endplate spurring. Mild to moderate left and mild right
C6 foraminal stenosis.

C6-C7:  Minimal disc bulge.  No significant stenosis.

C7-T1:  Negative.

Negative visible upper thoracic levels.
IMPRESSION: 1.  Normal MRI appearance of the brain.
2. Abnormal cervical spine:
3. Bulky disc herniation at C5-C6 eccentric to the left with
moderate spinal stenosis, moderate cord mass effect, and abnormal
cord signal compatible with edema versus developing myelomalacia
secondary to compressive myelopathy.
4. Smaller disc herniation at C3-C4 with up to mild spinal stenosis
and cord mass effect.
5. Mild spinal stenosis at C4-C5 related to disc bulging plus
congenital short pedicles.

## 2020-07-27 IMAGING — RF DG C-ARM 61-120 MIN
1 series · 2 of 2 positions shown · non-contrast
Comparison: None.

CLINICAL DATA: C5-6 cervical decompression.

FLUOROSCOPY TIME:  18 seconds.
EXAM:
DG C-ARM 61-120 MIN

[Series 1: run · 2 of 2 slices shown]
[im 1/2]
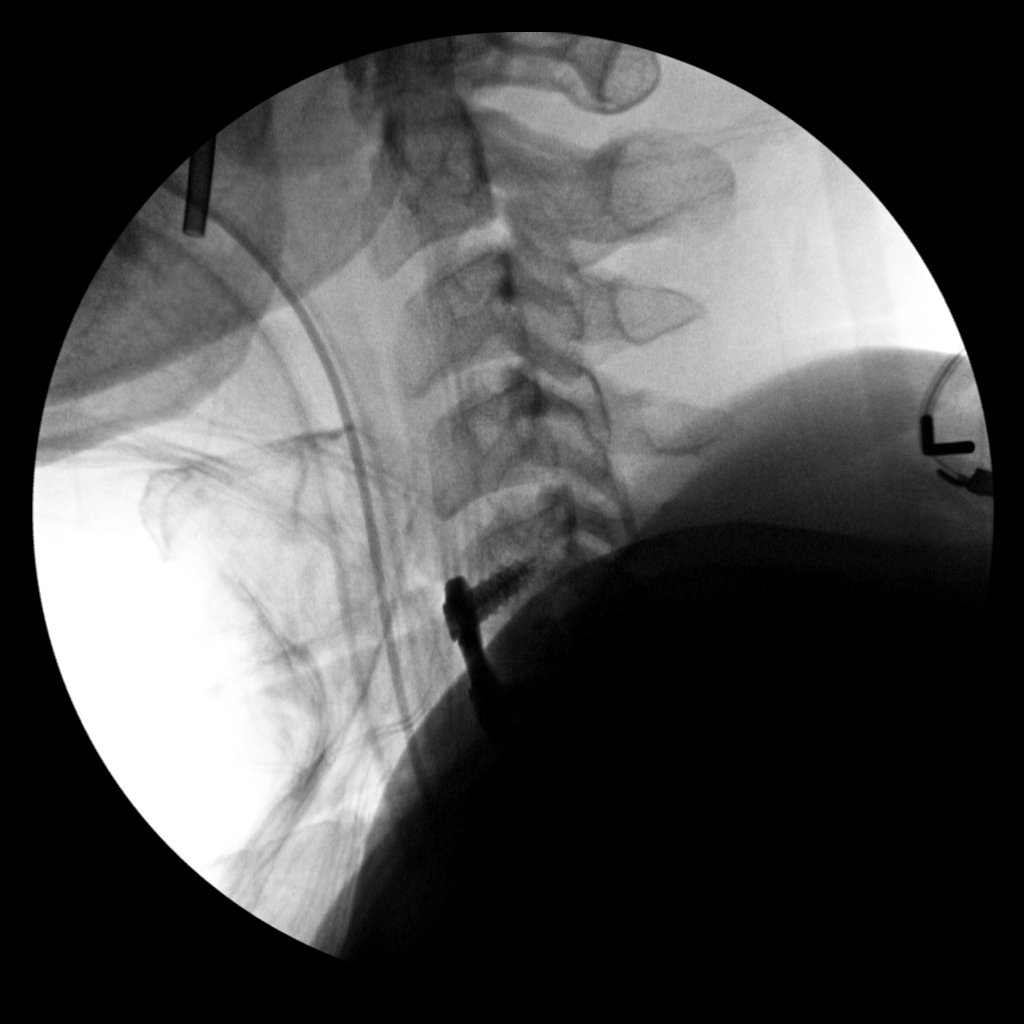
[im 2/2]
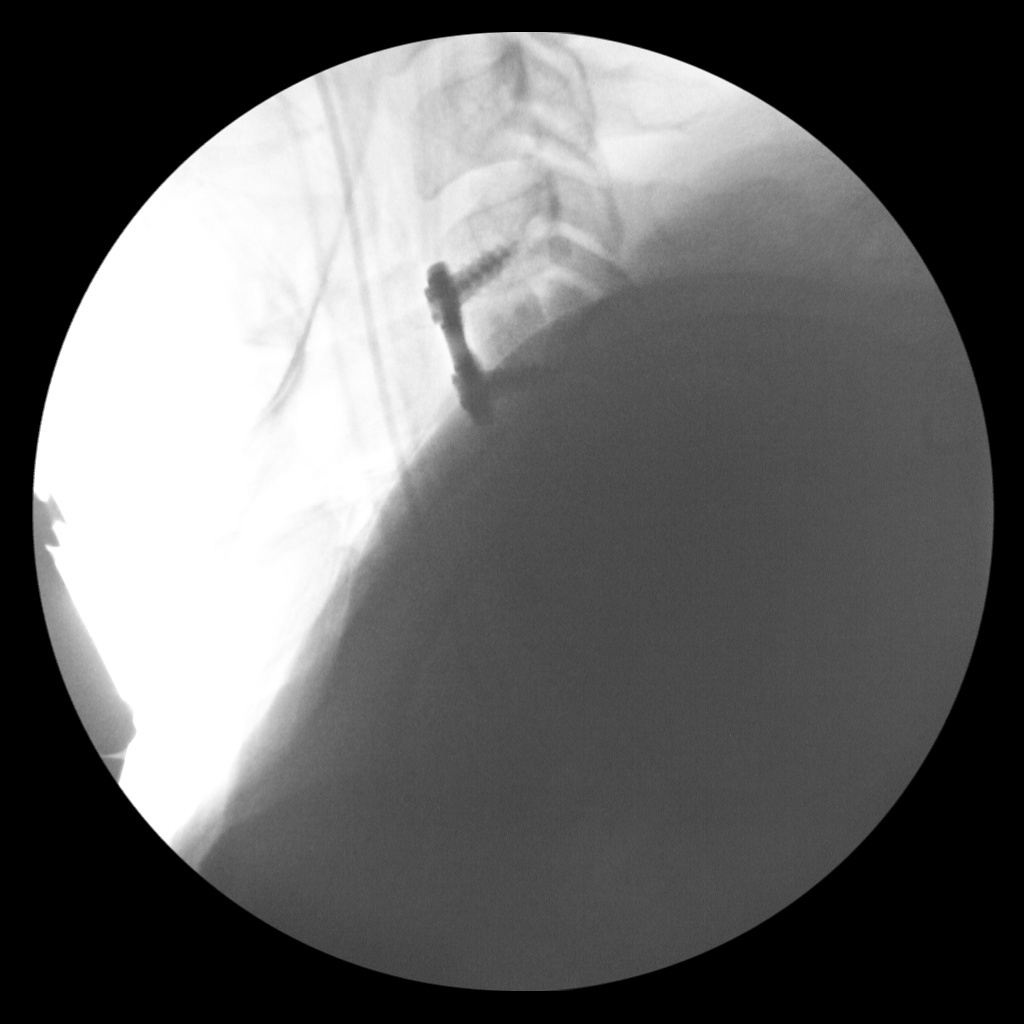

[2 of 2 positions shown; findings below may reference images not displayed]

FINDINGS: Anterior plate and screws have been affixed to the cervical spine at
C5 and C6. Hardware is in good position. It appears bone plug
material is been placed but it is not well assessed.
IMPRESSION: 1. Anterior plate and screws have been placed at C5 and C6, in good
position.
2. It appears bone plug material may have been placed at the C5-6
level but the exact location of this material is not well assessed
on provided images.

## 2023-06-08 ENCOUNTER — Other Ambulatory Visit: Payer: Self-pay

## 2023-06-08 ENCOUNTER — Encounter (HOSPITAL_COMMUNITY): Payer: Self-pay | Admitting: Emergency Medicine

## 2023-06-08 ENCOUNTER — Emergency Department (HOSPITAL_COMMUNITY)
Admission: EM | Admit: 2023-06-08 | Discharge: 2023-06-08 | Disposition: A | Discharge status: Home / Self Care | Attending: Emergency Medicine | Admitting: Emergency Medicine

## 2023-06-08 DIAGNOSIS — R519 Headache, unspecified: Secondary | ICD-10-CM | POA: Diagnosis present

## 2023-06-08 LAB — COMPREHENSIVE METABOLIC PANEL WITH GFR
ALT: 14 U/L (ref 0–44)
AST: 22 U/L (ref 15–41)
Albumin: 3.7 g/dL (ref 3.5–5.0)
Alkaline Phosphatase: 68 U/L (ref 38–126)
Anion gap: 7 (ref 5–15)
BUN: 9 mg/dL (ref 6–20)
CO2: 25 mmol/L (ref 22–32)
Calcium: 8.9 mg/dL (ref 8.9–10.3)
Chloride: 105 mmol/L (ref 98–111)
Creatinine, Ser: 0.89 mg/dL (ref 0.61–1.24)
GFR, Estimated: >60 mL/min (ref >60)
Glucose, Bld: 90 mg/dL (ref 70–99)
Potassium: 3.9 mmol/L (ref 3.5–5.1)
Sodium: 137 mmol/L (ref 135–145)
Total Bilirubin: 1.4 mg/dL — ABNORMAL HIGH (ref 0.0–1.2)
Total Protein: 7 g/dL (ref 6.5–8.1)

## 2023-06-08 LAB — CBC
HCT: 44.9 % (ref 39.0–52.0)
Hemoglobin: 15 g/dL (ref 13.0–17.0)
MCH: 29.6 pg (ref 26.0–34.0)
MCHC: 33.4 g/dL (ref 30.0–36.0)
MCV: 88.6 fL (ref 80.0–100.0)
Platelets: 262 10*3/uL (ref 150–400)
RBC: 5.07 MIL/uL (ref 4.22–5.81)
RDW: 15.8 % — ABNORMAL HIGH (ref 11.5–15.5)
WBC: 8.2 10*3/uL (ref 4.0–10.5)
nRBC: 0 % (ref 0.0–0.2)

## 2023-06-08 MED ORDER — DIPHENHYDRAMINE HCL 50 MG/ML IJ SOLN
12.5000 mg | Freq: Once | INTRAMUSCULAR | Status: AC
  Administered 2023-06-08: 12.5 mg via INTRAVENOUS
  Filled 2023-06-08: qty 1

## 2023-06-08 MED ORDER — METOCLOPRAMIDE HCL 5 MG/ML IJ SOLN
5.0000 mg | Freq: Once | INTRAMUSCULAR | Status: AC
  Administered 2023-06-08: 5 mg via INTRAVENOUS
  Filled 2023-06-08: qty 2

## 2023-06-08 MED ORDER — KETOROLAC TROMETHAMINE 15 MG/ML IJ SOLN
15.0000 mg | Freq: Once | INTRAMUSCULAR | Status: AC
  Administered 2023-06-08: 15 mg via INTRAVENOUS
  Filled 2023-06-08: qty 1

## 2023-06-08 MED ORDER — NAPROXEN 500 MG PO TABS
500.0000 mg | ORAL_TABLET | Freq: Two times a day (BID) | ORAL | 0 refills | Status: AC
Start: 2023-06-08 — End: ?

## 2023-06-08 NOTE — ED Triage Notes (Addendum)
 Pt c/o intermittent headaches since April from "lack of sleep", current headache started at 0500 today.
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
## 2023-06-08 NOTE — Discharge Instructions (Signed)
 Please follow-up with primary care.  If you continue to have daily frequent headaches consider preventative migraine medicine or referral to neurology.  Return to ER with new or worsening symptoms.

## 2023-10-25 ENCOUNTER — Ambulatory Visit: Admission: EM | Admit: 2023-10-25 | Discharge: 2023-10-25 | Disposition: A | Discharge status: Home / Self Care

## 2023-10-25 DIAGNOSIS — R070 Pain in throat: Secondary | ICD-10-CM

## 2023-10-25 DIAGNOSIS — J029 Acute pharyngitis, unspecified: Secondary | ICD-10-CM

## 2023-10-25 LAB — POCT RAPID STREP A (OFFICE): Rapid Strep A Screen: NEGATIVE

## 2023-10-25 NOTE — ED Provider Notes (Signed)
 [facility]    CSN: [id number] Arrival date & time: 10/25/23  0905      History   Chief Complaint Chief Complaint  Patient presents with   Sore Throat    HPI Bryan Matthews is a 43 y.o. male.   Pt presents today due to left sided throat pain since Saturday. Pt denies fever, known sick contacts but did attend the homecoming this weekend, new or different cough, shortness of breath, or nasal congestion. Pt denies use of medicine for symptoms.    Sore Throat    Past Medical History:  Diagnosis Date   Asthma     Patient Active Problem List   Diagnosis Date Noted   Cervical spinal cord compression (HCC) 07/15/2018   Alcohol abuse    Tobacco abuse    Compression of spinal cord (HCC) 07/14/2018    Past Surgical History:  Procedure Laterality Date   ANTERIOR CERVICAL DECOMP/DISCECTOMY FUSION N/A 07/15/2018   Procedure: CERVICAL FIVE-SIX ANTERIOR CERVICAL DECOMPRESSION/DISCECTOMY FUSION;  Surgeon: Cheryle Debby LABOR, MD;  Location: [facility] OR;  Service: Neurosurgery;  Laterality: N/A;       Home Medications    Prior to Admission medications   Medication Sig Start Date End Date Taking? Authorizing Provider  naproxen  (NAPROSYN ) 500 MG tablet Take 1 tablet (500 mg total) by mouth 2 (two) times daily. 06/08/23   Barrett, Warren SAILOR, PA-C  tiZANidine  (ZANAFLEX ) 4 MG tablet Take 1 tablet (4 mg total) by mouth at bedtime. 04/27/20   Arloa Suzen RAMAN, NP    Family History History reviewed. No pertinent family history.  Social History Social History   Tobacco Use   Smoking status: Every Day    Current packs/day: 1.00    Types: Cigarettes   Smokeless tobacco: Never  Substance Use Topics   Alcohol use: Yes    Alcohol/week: 12.0 standard drinks of alcohol    Types: 12 Cans of beer per week   Drug use: Not Currently     Allergies   Patient has no known allergies.   Review of Systems Review of Systems   Physical Exam Triage Vital Signs ED Triage  Vitals  Encounter Vitals Group     BP 10/25/23 0919 (!) 150/92     Girls Systolic BP Percentile --      Girls Diastolic BP Percentile --      Boys Systolic BP Percentile --      Boys Diastolic BP Percentile --      Pulse Rate 10/25/23 0919 74     Resp 10/25/23 0919 18     Temp 10/25/23 0919 97.9 F (36.6 C)     Temp Source 10/25/23 0919 Oral     SpO2 10/25/23 0919 96 %     Weight --      Height --      Head Circumference --      Peak Flow --      Pain Score 10/25/23 0918 7     Pain Loc --      Pain Education --      Exclude from Growth Chart --    No data found.  Updated Vital Signs BP (!) 150/92 (BP Location: Right Arm)   Pulse 74   Temp 97.9 F (36.6 C) (Oral)   Resp 18   SpO2 96%   Visual Acuity Right Eye Distance:   Left Eye Distance:   Bilateral Distance:    Right Eye Near:   Left Eye Near:  Bilateral Near:     Physical Exam Vitals and nursing note reviewed.  Constitutional:      General: He is not in acute distress.    Appearance: Normal appearance. He is not ill-appearing, toxic-appearing or diaphoretic.  HENT:     Mouth/Throat:     Mouth: Mucous membranes are moist.     Pharynx: Oropharynx is clear. No oropharyngeal exudate or posterior oropharyngeal erythema.  Eyes:     General: No scleral icterus. Cardiovascular:     Rate and Rhythm: Normal rate and regular rhythm.     Heart sounds: Normal heart sounds.  Pulmonary:     Effort: Pulmonary effort is normal. No respiratory distress.     Breath sounds: Normal breath sounds. No wheezing or rhonchi.  Musculoskeletal:     Cervical back: Tenderness (left side, anterior cervical) present.  Lymphadenopathy:     Cervical: No cervical adenopathy.  Skin:    General: Skin is warm.  Neurological:     Mental Status: He is alert and oriented to person, place, and time.  Psychiatric:        Mood and Affect: Mood normal.        Behavior: Behavior normal.      UC Treatments / Results  Labs (all labs  ordered are listed, but only abnormal results are displayed) Labs Reviewed  POCT RAPID STREP A (OFFICE) - Normal    EKG   Radiology No results found.  Procedures Procedures (including critical care time)  Medications Ordered in UC Medications - No data to display  Initial Impression / Assessment and Plan / UC Course  I have reviewed the triage vital signs and the nursing notes.  Pertinent labs & imaging results that were available during my care of the patient were reviewed by me and considered in my medical decision making (see chart for details).     Acute pharyngitis-PE unremarkable, pt does not appear to be in acute distress during interview, advised patient on supportive treatments.  Final Clinical Impressions(s) / UC Diagnoses   Final diagnoses:  Throat pain  Acute pharyngitis, unspecified etiology     Discharge Instructions      You been diagnosed with a viral illness today. -Viruses have to run their course and medicines that are prescribed are meant to help with symptoms. - With viruses usually feel poorly from 3 to 7 days with cough being the last symptoms to resolve.  -Cough can linger from days to weeks.  Antibiotics are not effective for viruses. -If your cough lasts more than 2 weeks and you are coughing so hard that you are vomiting or feel like you could pass out we need to follow-up with PCP for further testing and evaluation. -Rest, increase water intake, may use pseudoephedrine for nasal congestion, Delsym (dextromethorphan) or honey as needed for cough, and ibuprofen  and/or Tylenol  as directed on packaging for pain and fever. -If you have hypertension you should take Coricidin or other OTC meds approved for people with high blood pressure. -You may use a spoonful of honey every 4-6 hours as needed for throat pain and cough. -Warm tea with honey and lemon are helpful for soothe throat as well.  Chloraseptic and Cepacol make a throat lozenge with numbing  medication, can be purchased over-the-counter. -May also use Flonase or sinus rinse for sinus pressure or nasal congestion.  Be sure to use distilled bottled water for sinus rinses. -May use coolmist humidifier to open up nasal passages -May elevate head to assist with  postnasal drainage. -If you feel poorly (fever, fatigue, shortness of breath, nausea, etc.) for more than 10 days to be sure to follow-up with PCP or in clinic for further evaluation and additional treatments. If you experience chest pain with shortness of breath or pulse oxygen less than 95% you should report to the ER.      ED Prescriptions   None    PDMP not reviewed this encounter.   Andra Corean BROCKS, PA-C 10/25/23 1000

## 2023-10-25 NOTE — Discharge Instructions (Signed)

## 2023-10-25 NOTE — ED Triage Notes (Signed)
 Pt present sore throat, symptoms started on Saturday. Pt states the pain on his left side.

## 2023-12-27 ENCOUNTER — Ambulatory Visit
Admission: EM | Admit: 2023-12-27 | Discharge: 2023-12-27 | Disposition: A | Discharge status: Home / Self Care | Source: Home / Self Care

## 2023-12-27 ENCOUNTER — Encounter: Payer: Self-pay | Admitting: Emergency Medicine

## 2023-12-27 DIAGNOSIS — H04321 Acute dacryocystitis of right lacrimal passage: Secondary | ICD-10-CM

## 2023-12-27 MED ORDER — CLINDAMYCIN HCL 300 MG PO CAPS: 300.0000 mg | ORAL_CAPSULE | Freq: Three times a day (TID) | ORAL | 0 refills | Status: AC

## 2023-12-27 NOTE — ED Triage Notes (Addendum)
 Pt reports issue with R eye x2 weeks. Initially thought it was a stye and applied an OTC stye ointment to the area with no improvement. Pt has swollen area to the side of his tear duct. Notes this area is tender to touch. Notes itchiness and irritation are the main issue. Denies vision changes. Denies eye redness. Warm and cold compresses and ibuprofen  with no relief. Denies light sensitivity and drainage.

## 2023-12-27 NOTE — Discharge Instructions (Signed)
 Use warm compresses, as hot as you can stand, 20 mins at time a couple times a day.   Be sure to take antibiotics until all pills are gone  Make an appointment with eye doctor for follow-up if symptoms do not improve

## 2023-12-27 NOTE — ED Provider Notes (Signed)
 [facility]    CSN: [id number] Arrival date & time: 12/27/23  0804      History   Chief Complaint Chief Complaint  Patient presents with   Eye Problem    HPI Bryan Matthews is a 43 y.o. male.   Pt presents today due to 2 weeks of erythematous, tender, swollen right lower eyelid. Pt denies changes to vision but admits to sensitivity of light. Pt states that he has been using warm compresses and OTC eye drops with no relief.   The history is provided by the patient.  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 Authorizing Provider  budesonide-formoterol (SYMBICORT) 80-4.5 MCG/ACT inhaler Inhale 2 puffs into the lungs. 08/23/23  Yes [provider]  clindamycin  (CLEOCIN ) 300 MG capsule Take 1 capsule (300 mg total) by mouth 3 (three) times daily for 7 days. 12/27/23 01/03/24 Yes Andra Krabbe C, PA-C  losartan (COZAAR) 50 MG tablet Take 50 mg by mouth daily. 07/17/23  Yes [provider]  tadalafil (CIALIS) 5 MG tablet Take 5 mg by mouth daily. 07/17/23  Yes [provider]  Vitamin D, Ergocalciferol, (DRISDOL) 1.25 MG (50000 UNIT) CAPS capsule Take 50,000 Units by  mouth every 7 (seven) days. 08/14/23  Yes [provider]  cyclobenzaprine  (FLEXERIL ) 5 MG tablet Take 5 mg by mouth 3 (three) times daily as needed. Patient not taking: Reported on 12/27/2023    [provider]  montelukast (SINGULAIR) 10 MG tablet Take 10 mg by mouth at bedtime. Patient not taking: Reported on 12/27/2023 08/23/23   [provider]  naproxen  (NAPROSYN ) 500 MG tablet Take 1 tablet (500 mg total) by mouth 2 (two) times daily. Patient not taking: Reported on 12/27/2023 06/08/23   Barrett, Jamie N, PA-C  predniSONE (DELTASONE) 20 MG tablet Take 20 mg by mouth 2 (two) times daily with a meal. Patient not taking: Reported on 12/27/2023    [provider]  tiZANidine  (ZANAFLEX ) 4 MG tablet Take 1 tablet (4 mg total) by mouth at bedtime. Patient not taking: Reported on 12/27/2023 04/27/20   Arloa Suzen RAMAN, NP  triamcinolone cream (KENALOG) 0.1 % Apply 1 Application topically. Patient not taking: Reported on 12/27/2023 12/14/16   [provider]  VENTOLIN  HFA 108 (90 Base) MCG/ACT inhaler Inhale 2 puffs into the lungs every 4 (four) hours as needed. 08/25/23   [provider]    Family History History reviewed. No pertinent family history.  Social History Social History[1]   Allergies   Patient has no known allergies.   Review of Systems Review of Systems   Physical Exam Triage Vital Signs ED Triage Vitals  Encounter Vitals Group     BP 12/27/23 0815 ROLLEN)  149/92     Girls Systolic BP Percentile --      Girls Diastolic BP Percentile --      Boys Systolic BP Percentile --      Boys Diastolic BP Percentile --      Pulse Rate 12/27/23 0815 86     Resp 12/27/23 0815 18     Temp 12/27/23 0815 98.2 F (36.8 C)     Temp Source 12/27/23 0815 Oral     SpO2 12/27/23 0815 96 %     Weight --      Height --      Head Circumference --      Peak Flow --      Pain Score 12/27/23 0831 6     Pain Loc --      Pain Education --       Exclude from Growth Chart --    No data found.  Updated Vital Signs BP (!) 149/92 (BP Location: Left Arm)   Pulse 86   Temp 98.2 F (36.8 C) (Oral)   Resp 18   SpO2 96%   Visual Acuity Right Eye Distance: 20/13 (uncorrected) Left Eye Distance: 20/10 (uncorrected) Bilateral Distance: 20/13 (uncorrected)  Right Eye Near:   Left Eye Near:    Bilateral Near:     Physical Exam Vitals and nursing note reviewed.  Constitutional:      General: He is not in acute distress.    Appearance: Normal appearance. He is not ill-appearing, toxic-appearing or diaphoretic.  Eyes:     General: No scleral icterus.    Extraocular Movements: Extraocular movements intact.     Pupils: Pupils are equal, round, and reactive to light.      Comments: Tender, erythematous, raised area over right tear duct  Cardiovascular:     Rate and Rhythm: Normal rate and regular rhythm.     Heart sounds: Normal heart sounds.  Pulmonary:     Effort: Pulmonary effort is normal. No respiratory distress.     Breath sounds: Normal breath sounds. No wheezing or rhonchi.  Skin:    General: Skin is warm.  Neurological:     Mental Status: He is alert and oriented to person, place, and time.  Psychiatric:        Mood and Affect: Mood normal.        Behavior: Behavior normal.      UC Treatments / Results  Labs (all labs ordered are listed, but only abnormal results are displayed) Labs Reviewed - No data to display  EKG   Radiology No results found.  Procedures Procedures (including critical care time)  Medications Ordered in UC Medications - No data to display  Initial Impression / Assessment and Plan / UC Course  I have reviewed the triage vital signs and the nursing notes.  Pertinent labs & imaging results that were available during my care of the patient were reviewed by me and considered in my medical decision making (see chart for details).     Final Clinical Impressions(s) / UC  Diagnoses   Final diagnoses:  Acute dacryocystitis of right lacrimal sac     Discharge Instructions      Use warm compresses, as hot as you can stand, 20 mins at time a couple times a day.   Be sure to take antibiotics until all pills are gone  Make an appointment with eye doctor for follow-up if symptoms do not improve     ED Prescriptions     Medication  Sig Dispense Auth. Provider   clindamycin  (CLEOCIN ) 300 MG capsule Take 1 capsule (300 mg total) by mouth 3 (three) times daily for 7 days. 21 capsule Andra Corean BROCKS, PA-C      PDMP not reviewed this encounter.    [1]  Social History Tobacco Use   Smoking status: Every Day    Current packs/day: 1.00    Types: Cigarettes   Smokeless tobacco: Never  Vaping Use   Vaping status: Never Used  Substance Use Topics   Alcohol use: Yes    Alcohol/week: 12.0 standard drinks of alcohol    Types: 12 Cans of beer per week   Drug use: Not Currently     Andra Corean BROCKS DEVONNA 12/27/23 9147
